# Patient Record
Sex: Male | Born: 1985 | Race: Black or African American | Hispanic: No | Marital: Married | Smoking: Never smoker
Health system: Southern US, Community
[De-identification: ages and names within clinical notes are randomized; demographics above are authoritative.]

## PROBLEM LIST (undated history)

## (undated) DIAGNOSIS — J302 Other seasonal allergic rhinitis: Secondary | ICD-10-CM

---

## 2012-01-18 ENCOUNTER — Encounter (HOSPITAL_BASED_OUTPATIENT_CLINIC_OR_DEPARTMENT_OTHER): Payer: Self-pay | Admitting: *Deleted

## 2012-01-18 ENCOUNTER — Emergency Department (HOSPITAL_BASED_OUTPATIENT_CLINIC_OR_DEPARTMENT_OTHER)
Admission: EM | Admit: 2012-01-18 | Discharge: 2012-01-18 | Disposition: A | Discharge status: Home / Self Care | Payer: Self-pay | Attending: Emergency Medicine | Admitting: Emergency Medicine

## 2012-01-18 DIAGNOSIS — K529 Noninfective gastroenteritis and colitis, unspecified: Secondary | ICD-10-CM

## 2012-01-18 DIAGNOSIS — R112 Nausea with vomiting, unspecified: Secondary | ICD-10-CM | POA: Insufficient documentation

## 2012-01-18 DIAGNOSIS — R197 Diarrhea, unspecified: Secondary | ICD-10-CM | POA: Insufficient documentation

## 2012-01-18 LAB — CBC
MCH: 28.3 pg (ref 26.0–34.0)
MCHC: 36 g/dL (ref 30.0–36.0)
MCV: 78.6 fL (ref 78.0–100.0)
Platelets: 164 10*3/uL (ref 150–400)
RBC: 6.08 MIL/uL — ABNORMAL HIGH (ref 4.22–5.81)

## 2012-01-18 LAB — COMPREHENSIVE METABOLIC PANEL
AST: 24 U/L (ref 0–37)
CO2: 23 mEq/L (ref 19–32)
Calcium: 10.6 mg/dL — ABNORMAL HIGH (ref 8.4–10.5)
Creatinine, Ser: 1.3 mg/dL (ref 0.50–1.35)
GFR calc Af Amer: 88 mL/min — ABNORMAL LOW (ref >90)
GFR calc non Af Amer: 76 mL/min — ABNORMAL LOW (ref >90)
Glucose, Bld: 129 mg/dL — ABNORMAL HIGH (ref 70–99)
Total Protein: 8.2 g/dL (ref 6.0–8.3)

## 2012-01-18 MED ORDER — DIPHENOXYLATE-ATROPINE 2.5-0.025 MG PO TABS
2.0000 | ORAL_TABLET | Freq: Once | ORAL | Status: AC
  Administered 2012-01-18: 2 via ORAL
  Filled 2012-01-18: qty 2

## 2012-01-18 MED ORDER — ONDANSETRON HCL 4 MG/2ML IJ SOLN
4.0000 mg | Freq: Once | INTRAMUSCULAR | Status: AC
  Administered 2012-01-18: 4 mg via INTRAVENOUS

## 2012-01-18 MED ORDER — SODIUM CHLORIDE 0.9 % IV BOLUS (SEPSIS)
1000.0000 mL | Freq: Once | INTRAVENOUS | Status: AC
  Administered 2012-01-18: 1000 mL via INTRAVENOUS

## 2012-01-18 MED ORDER — DIPHENOXYLATE-ATROPINE 2.5-0.025 MG PO TABS: 1.0000 | ORAL_TABLET | Freq: Four times a day (QID) | ORAL | Status: AC | PRN

## 2012-01-18 MED ORDER — ONDANSETRON HCL 4 MG/2ML IJ SOLN
INTRAMUSCULAR | Status: AC
  Filled 2012-01-18: qty 2

## 2012-01-18 MED ORDER — ONDANSETRON 8 MG PO TBDP: 8.0000 mg | ORAL_TABLET | Freq: Three times a day (TID) | ORAL | Status: AC | PRN

## 2012-01-18 NOTE — ED Notes (Signed)
Report received from Andrea RN.

## 2012-01-18 NOTE — ED Notes (Signed)
Pt reports sudden onset of multiple episodes N/V/D tonight.

## 2012-01-18 NOTE — ED Provider Notes (Signed)
History     CSN: [id number]  Arrival date & time 01/18/12  0019   First MD Initiated Contact with Patient 01/18/12 [phone]      Chief Complaint  Patient presents with  . Nausea, vomiting and diarrhea     (Consider location/radiation/quality/duration/timing/severity/associated sxs/prior treatment) HPI This is a 26 year old black male with the onset of nausea, vomiting and diarrhea yesterday evening about 8:30. He states the symptoms were severe at the time but he is no longer nauseated. There was some associated abdominal cramping but no focal or persistent pain. He was given a liter of normal saline IV and Zofran by nursing staff per protocol. He states he feels better but is still thirsty. He has not had any sick contacts.  History reviewed. No pertinent past medical history.  History reviewed. No pertinent past surgical history.  No family history on file.  History  Substance Use Topics  . Smoking status: Never Smoker   . Smokeless tobacco: Not on file  . Alcohol Use: No      Review of Systems  All other systems reviewed and are negative.    Allergies  Review of patient's allergies indicates no known allergies.  Home Medications  No current outpatient prescriptions on file.  BP 121/71  Pulse 117  Temp(Src) 97.9 F (36.6 C) (Oral)  Resp 20  Ht 5\' 10"  (1.778 m)  Wt 175 lb (79.379 kg)  BMI 25.11 kg/m2  SpO2 100%  Physical Exam General: Well-developed, well-nourished male in no acute distress; appearance consistent with age of record HENT: normocephalic, atraumatic; dry mucous the Eyes: pupils equal round and reactive to light; extraocular muscles intact Neck: supple Heart: regular rate and rhythm; tachycardia Lungs: clear to auscultation bilaterally Abdomen: soft; nondistended; nontender; no masses or hepatosplenomegaly; bowel sounds hypoactive Extremities: No deformity; full range of motion; pulses normal Neurologic: Awake, alert and oriented; motor function  intact in all extremities and symmetric; no facial droop Skin: Warm and dry Psychiatric: Normal mood and affect    ED Course  Procedures (including critical care time)     MDM   Nursing notes and vitals signs, including pulse oximetry, reviewed.  Summary of this visit's results, reviewed by myself:  Labs:  Results for orders placed during the hospital encounter of 01/18/12  CBC      Component Value Range   WBC 10.2  4.0 - 10.5 (K/uL)   RBC 6.08 (*) 4.22 - 5.81 (MIL/uL)   Hemoglobin 17.2 (*) 13.0 - 17.0 (g/dL)   HCT 78.4  69.6 - 29.5 (%)   MCV 78.6  78.0 - 100.0 (fL)   MCH 28.3  26.0 - 34.0 (pg)   MCHC 36.0  30.0 - 36.0 (g/dL)   RDW 28.4  13.2 - 44.0 (%)   Platelets 164  150 - 400 (K/uL)  COMPREHENSIVE METABOLIC PANEL      Component Value Range   Sodium 141  135 - 145 (mEq/L)   Potassium 5.0  3.5 - 5.1 (mEq/L)   Chloride 102  96 - 112 (mEq/L)   CO2 23  19 - 32 (mEq/L)   Glucose, Bld 129 (*) 70 - 99 (mg/dL)   BUN 24 (*) 6 - 23 (mg/dL)   Creatinine, Ser 1.02  0.50 - 1.35 (mg/dL)   Calcium 72.5 (*) 8.4 - 10.5 (mg/dL)   Total Protein 8.2  6.0 - 8.3 (g/dL)   Albumin 4.9  3.5 - 5.2 (g/dL)   AST 24  0 - 37 (U/L)   ALT 12  0 - 53 (U/L)   Alkaline Phosphatase 75  39 - 117 (U/L)   Total Bilirubin 0.6  0.3 - 1.2 (mg/dL)   GFR calc non Af Amer 76 (*) >90 (mL/min)   GFR calc Af Amer 88 (*) >90 (mL/min)   2:44 AM Drinking fluids without emesis.         Hanley Seamen, MD 01/18/12 [phone]

## 2012-01-18 NOTE — ED Notes (Signed)
Pt given ice chips. States nausea has subsided greatly.

## 2013-09-08 ENCOUNTER — Emergency Department (HOSPITAL_BASED_OUTPATIENT_CLINIC_OR_DEPARTMENT_OTHER): Payer: Self-pay

## 2013-09-08 ENCOUNTER — Emergency Department (HOSPITAL_BASED_OUTPATIENT_CLINIC_OR_DEPARTMENT_OTHER)
Admission: EM | Admit: 2013-09-08 | Discharge: 2013-09-08 | Disposition: A | Discharge status: Home / Self Care | Payer: Self-pay | Attending: Emergency Medicine | Admitting: Emergency Medicine

## 2013-09-08 ENCOUNTER — Encounter (HOSPITAL_BASED_OUTPATIENT_CLINIC_OR_DEPARTMENT_OTHER): Payer: Self-pay | Admitting: Emergency Medicine

## 2013-09-08 DIAGNOSIS — X58XXXA Exposure to other specified factors, initial encounter: Secondary | ICD-10-CM | POA: Insufficient documentation

## 2013-09-08 DIAGNOSIS — Y929 Unspecified place or not applicable: Secondary | ICD-10-CM | POA: Insufficient documentation

## 2013-09-08 DIAGNOSIS — R05 Cough: Secondary | ICD-10-CM | POA: Insufficient documentation

## 2013-09-08 DIAGNOSIS — B9789 Other viral agents as the cause of diseases classified elsewhere: Secondary | ICD-10-CM | POA: Insufficient documentation

## 2013-09-08 DIAGNOSIS — S335XXA Sprain of ligaments of lumbar spine, initial encounter: Secondary | ICD-10-CM | POA: Insufficient documentation

## 2013-09-08 DIAGNOSIS — Y939 Activity, unspecified: Secondary | ICD-10-CM | POA: Insufficient documentation

## 2013-09-08 DIAGNOSIS — S39012A Strain of muscle, fascia and tendon of lower back, initial encounter: Secondary | ICD-10-CM

## 2013-09-08 DIAGNOSIS — IMO0002 Reserved for concepts with insufficient information to code with codable children: Secondary | ICD-10-CM | POA: Insufficient documentation

## 2013-09-08 DIAGNOSIS — R059 Cough, unspecified: Secondary | ICD-10-CM | POA: Insufficient documentation

## 2013-09-08 LAB — CBC WITH DIFFERENTIAL/PLATELET
Eosinophils Absolute: 0.1 10*3/uL (ref 0.0–0.7)
Lymphocytes Relative: 27 % (ref 12–46)
Lymphs Abs: 1.4 10*3/uL (ref 0.7–4.0)
Neutro Abs: 3.1 10*3/uL (ref 1.7–7.7)
Neutrophils Relative %: 62 % (ref 43–77)
Platelets: 191 10*3/uL (ref 150–400)
RBC: 5.24 MIL/uL (ref 4.22–5.81)
WBC: 5.1 10*3/uL (ref 4.0–10.5)

## 2013-09-08 LAB — BASIC METABOLIC PANEL
CO2: 25 mEq/L (ref 19–32)
Chloride: 102 mEq/L (ref 96–112)
GFR calc non Af Amer: >90 mL/min (ref >90)
Glucose, Bld: 103 mg/dL — ABNORMAL HIGH (ref 70–99)
Potassium: 4 mEq/L (ref 3.5–5.1)
Sodium: 138 mEq/L (ref 135–145)

## 2013-09-08 LAB — URINALYSIS, ROUTINE W REFLEX MICROSCOPIC
Nitrite: NEGATIVE
Specific Gravity, Urine: 1.021 (ref 1.005–1.030)
Urobilinogen, UA: 1 mg/dL (ref 0.0–1.0)

## 2013-09-08 MED ORDER — OXYCODONE-ACETAMINOPHEN 5-325 MG PO TABS
2.0000 | ORAL_TABLET | Freq: Once | ORAL | Status: AC
  Administered 2013-09-08: 2 via ORAL
  Filled 2013-09-08: qty 2

## 2013-09-08 NOTE — ED Provider Notes (Signed)
CSN: [id number]     Arrival date & time 09/08/13  0541 History   First MD Initiated Contact with Patient 09/08/13 [phone]     Chief Complaint  Patient presents with  . Back Pain  . Cough   (Consider location/radiation/quality/duration/timing/severity/associated sxs/prior Treatment) HPI 27 y.o. Male complaining of right lower back pain began yesterday with no known trauma.  Pain is throbbing and worsens with movement.  No hematuria, frequency, or dysuria.  He has not had any similar symptoms in the past.  He has had a recent uri with some residual coughing.  He took tylenol and muscle relaxants with pain decreasing to 5/10.   History reviewed. No pertinent past medical history. History reviewed. No pertinent past surgical history. No family history on file. History  Substance Use Topics  . Smoking status: Never Smoker   . Smokeless tobacco: Not on file  . Alcohol Use: No    Review of Systems  All other systems reviewed and are negative.    Allergies  Review of patient's allergies indicates no known allergies.  Home Medications  No current outpatient prescriptions on file. BP 140/80  Pulse 88  Temp(Src) 98.3 F (36.8 C) (Oral)  Resp 18  Ht 5\' 9"  (1.753 m)  Wt 185 lb (83.915 kg)  BMI 27.31 kg/m2  SpO2 100% Physical Exam  Nursing note and vitals reviewed. Constitutional: He is oriented to person, place, and time. He appears well-developed and well-nourished.  HENT:  Head: Normocephalic and atraumatic.  Right Ear: Tympanic membrane and external ear normal.  Left Ear: Tympanic membrane and external ear normal.  Nose: Nose normal. Right sinus exhibits no maxillary sinus tenderness and no frontal sinus tenderness. Left sinus exhibits no maxillary sinus tenderness and no frontal sinus tenderness.  Eyes: Conjunctivae and EOM are normal. Pupils are equal, round, and reactive to light. Right eye exhibits no nystagmus. Left eye exhibits no nystagmus.  Neck: Normal range of motion.  Neck supple.  Cardiovascular: Normal rate, regular rhythm, normal heart sounds and intact distal pulses.   Pulmonary/Chest: Effort normal and breath sounds normal. No respiratory distress. He exhibits no tenderness.  Abdominal: Soft. Bowel sounds are normal. He exhibits no distension and no mass. There is no tenderness.  Musculoskeletal: Normal range of motion. He exhibits tenderness. He exhibits no edema.  Back is slightly ttp in left lateral lumbar area- no erythema, swelling noted.   Neurological: He is alert and oriented to person, place, and time. He has normal strength and normal reflexes. No sensory deficit. He displays a negative Romberg sign. GCS eye subscore is 4. GCS verbal subscore is 5. GCS motor subscore is 6.  Reflex Scores:      Tricep reflexes are 2+ on the right side and 2+ on the left side.      Bicep reflexes are 2+ on the right side and 2+ on the left side.      Brachioradialis reflexes are 2+ on the right side and 2+ on the left side.      Patellar reflexes are 2+ on the right side and 2+ on the left side.      Achilles reflexes are 2+ on the right side and 2+ on the left side. Patient with normal gait without ataxia, shuffling, spasm, or antalgia. Speech is normal without dysarthria, dysphasia, or aphasia. Muscle strength is 5/5 in bilateral shoulders, elbow flexor and extensors, wrist flexor and extensors, and intrinsic hand muscles. 5/5 bilateral lower extremity hip flexors, extensors, knee flexors and extensors,  and ankle dorsi and plantar flexors.    Skin: Skin is warm and dry. No rash noted.  Psychiatric: He has a normal mood and affect. His behavior is normal. Judgment and thought content normal.    ED Course  Procedures (including critical care time) Labs Review Labs Reviewed  BASIC METABOLIC PANEL - Abnormal; Notable for the following:    Glucose, Bld 103 (*)    All other components within normal limits  URINALYSIS, ROUTINE W REFLEX MICROSCOPIC  CBC WITH  DIFFERENTIAL   Imaging Review Dg Chest 2 View  09/08/2013   CLINICAL DATA:  Left mid and upper back pain. Cough and shortness of breath.  EXAM: CHEST  2 VIEW  COMPARISON:  None.  FINDINGS: The lungs are well-aerated and clear. There is no evidence of focal opacification, pleural effusion or pneumothorax.  The heart is normal in size; the mediastinal contour is within normal limits. No acute osseous abnormalities are seen.  IMPRESSION: No acute cardiopulmonary process seen.   Electronically Signed   By: Roanna Raider M.D.   On: 09/08/2013 06:34    EKG Interpretation   None       MDM  No diagnosis found. Lumbar pain without evidence of neurologic abnormality.  Labs wnl, cxr done due to patient's recent uri and cough and no acute abnormality.      Hilario Quarry, MD 09/08/13 [phone]

## 2013-09-08 NOTE — ED Notes (Signed)
Pt complains of left mid/upper back pain.  Pt reports getting over the flu and having a cough.  Pt denies injury.  Denies trouble urinating. Pt complains of SOB.  Pt unable to report if he has had a fever.

## 2013-09-08 NOTE — ED Notes (Signed)
Pt complains of pain in left mid/upper back.  Pain is reproducible.  Pt complains of SOB but denies chest pain or fever.  Reports just getting over the flu.  Pt took a muscle relaxer and tylenol prior to coming in.

## 2013-09-08 NOTE — ED Notes (Signed)
Patient was seen this morning for back pain and is back because he states that the pain has gotten worse

## 2013-09-09 ENCOUNTER — Emergency Department (HOSPITAL_BASED_OUTPATIENT_CLINIC_OR_DEPARTMENT_OTHER)
Admission: EM | Admit: 2013-09-09 | Discharge: 2013-09-09 | Disposition: A | Discharge status: Home / Self Care | Payer: Self-pay | Attending: Emergency Medicine | Admitting: Emergency Medicine

## 2013-09-09 DIAGNOSIS — B349 Viral infection, unspecified: Secondary | ICD-10-CM

## 2013-09-09 DIAGNOSIS — T148XXA Other injury of unspecified body region, initial encounter: Secondary | ICD-10-CM

## 2013-09-09 MED ORDER — METHOCARBAMOL 500 MG PO TABS
ORAL_TABLET | ORAL | Status: AC
  Administered 2013-09-09: 1000 mg via ORAL
  Filled 2013-09-09: qty 2

## 2013-09-09 MED ORDER — KETOROLAC TROMETHAMINE 60 MG/2ML IM SOLN
INTRAMUSCULAR | Status: AC
  Administered 2013-09-09: 60 mg via INTRAMUSCULAR
  Filled 2013-09-09: qty 2

## 2013-09-09 MED ORDER — METHOCARBAMOL 500 MG PO TABS: 500.0000 mg | ORAL_TABLET | Freq: Two times a day (BID) | ORAL | Status: DC

## 2013-09-09 MED ORDER — MELOXICAM 7.5 MG PO TABS: 7.5000 mg | ORAL_TABLET | Freq: Every day | ORAL | Status: DC

## 2013-09-09 MED ORDER — KETOROLAC TROMETHAMINE 60 MG/2ML IM SOLN
60.0000 mg | Freq: Once | INTRAMUSCULAR | Status: AC
  Administered 2013-09-09: 60 mg via INTRAMUSCULAR

## 2013-09-09 MED ORDER — METHOCARBAMOL 500 MG PO TABS
1000.0000 mg | ORAL_TABLET | Freq: Once | ORAL | Status: AC
  Administered 2013-09-09: 1000 mg via ORAL

## 2013-09-09 MED ORDER — TRAMADOL HCL 50 MG PO TABS: 50.0000 mg | ORAL_TABLET | Freq: Four times a day (QID) | ORAL | Status: DC | PRN

## 2013-09-09 NOTE — ED Provider Notes (Signed)
CSN: [id number]     Arrival date & time 09/08/13  2117 History   First MD Initiated Contact with Patient 09/09/13 0051     Chief Complaint  Patient presents with  . Back Pain   (Consider location/radiation/quality/duration/timing/severity/associated sxs/prior Treatment) Patient is a 27 y.o. male presenting with back pain. The history is provided by the patient.  Back Pain Pain location: left posterior mid left back. Quality:  Aching Radiates to:  Does not radiate Pain severity:  Moderate Pain is:  Same all the time Onset quality:  Gradual Timing:  Constant Progression:  Unchanged Context: not emotional stress, not MCA, not MVA, not physical stress and not recent illness   Context comment:  Cough Relieved by:  Nothing Worsened by:  Nothing tried Ineffective treatments:  None tried Associated symptoms: no abdominal pain, no abdominal swelling, no bladder incontinence, no bowel incontinence, no chest pain, no dysuria, no fever, no headaches, no leg pain, no numbness, no paresthesias, no pelvic pain, no perianal numbness, no tingling, no weakness and no weight loss   Risk factors: no hx of cancer   Seen earlier for cough and right back pain and now presents with left mid pain between the posterior ribs.    History reviewed. No pertinent past medical history. History reviewed. No pertinent past surgical history. No family history on file. History  Substance Use Topics  . Smoking status: Never Smoker   . Smokeless tobacco: Not on file  . Alcohol Use: No    Review of Systems  Constitutional: Negative for fever and weight loss.  Respiratory: Positive for cough. Negative for choking and chest tightness.   Cardiovascular: Negative for chest pain.  Gastrointestinal: Negative for abdominal pain and bowel incontinence.  Genitourinary: Negative for bladder incontinence, dysuria and pelvic pain.  Musculoskeletal: Positive for back pain.  Neurological: Negative for tingling, weakness,  numbness, headaches and paresthesias.  All other systems reviewed and are negative.    Allergies  Review of patient's allergies indicates no known allergies.  Home Medications  No current outpatient prescriptions on file. BP 153/85  Pulse 130  Temp(Src) 99.1 F (37.3 C) (Oral)  Resp 20  SpO2 98% Physical Exam  Constitutional: He is oriented to person, place, and time. He appears well-developed and well-nourished. No distress.  HENT:  Head: Normocephalic and atraumatic.  Mouth/Throat: Oropharynx is clear and moist.  Eyes: Conjunctivae are normal. Pupils are equal, round, and reactive to light.  Neck: Normal range of motion. Neck supple.  Cardiovascular: Normal rate, regular rhythm and intact distal pulses.   Pulmonary/Chest: Effort normal and breath sounds normal. He has no wheezes. He has no rales.    Abdominal: Soft. Bowel sounds are normal. There is no tenderness. There is no rebound and no guarding.  Musculoskeletal: Normal range of motion.  Neurological: He is alert and oriented to person, place, and time.  Skin: Skin is warm and dry.  Psychiatric: He has a normal mood and affect.    ED Course  Procedures (including critical care time) Labs Review Labs Reviewed  D-DIMER, QUANTITATIVE   Imaging Review Dg Chest 2 View  09/08/2013   CLINICAL DATA:  Left mid and upper back pain. Cough and shortness of breath.  EXAM: CHEST  2 VIEW  COMPARISON:  None.  FINDINGS: The lungs are well-aerated and clear. There is no evidence of focal opacification, pleural effusion or pneumothorax.  The heart is normal in size; the mediastinal contour is within normal limits. No acute osseous abnormalities are seen.  IMPRESSION: No acute cardiopulmonary process seen.   Electronically Signed   By: Roanna Raider M.D.   On: 09/08/2013 06:34    EKG Interpretation   None       MDM  No diagnosis found. Had negative labs and chest xrays this am and the area is in the posterior thoracic  region.  Ddimer is also negative excluding PE.  Pain is reproducible and between ribs.  Patient has likely strained a muscle with movement or coughing.  He is getting over the flu  Resting comfortably in the room.  Feels improved post medication  Sandrina Heaton K Nuh Lipton-Rasch, MD 09/09/13 [phone]

## 2014-07-07 ENCOUNTER — Encounter (HOSPITAL_BASED_OUTPATIENT_CLINIC_OR_DEPARTMENT_OTHER): Payer: Self-pay | Admitting: Emergency Medicine

## 2014-07-07 ENCOUNTER — Emergency Department (HOSPITAL_BASED_OUTPATIENT_CLINIC_OR_DEPARTMENT_OTHER)
Admission: EM | Admit: 2014-07-07 | Discharge: 2014-07-07 | Disposition: A | Discharge status: Home / Self Care | Payer: Self-pay | Attending: Emergency Medicine | Admitting: Emergency Medicine

## 2014-07-07 DIAGNOSIS — H00013 Hordeolum externum right eye, unspecified eyelid: Secondary | ICD-10-CM

## 2014-07-07 DIAGNOSIS — Z79899 Other long term (current) drug therapy: Secondary | ICD-10-CM | POA: Insufficient documentation

## 2014-07-07 DIAGNOSIS — H00011 Hordeolum externum right upper eyelid: Secondary | ICD-10-CM | POA: Insufficient documentation

## 2014-07-07 DIAGNOSIS — Z791 Long term (current) use of non-steroidal anti-inflammatories (NSAID): Secondary | ICD-10-CM | POA: Insufficient documentation

## 2014-07-07 MED ORDER — ERYTHROMYCIN 5 MG/GM OP OINT: TOPICAL_OINTMENT | OPHTHALMIC | Status: DC

## 2014-07-07 NOTE — ED Notes (Signed)
Pt has a sty on his right eye lid since last Sunday.

## 2014-07-07 NOTE — Discharge Instructions (Signed)
Sty A sty (hordeolum) is an infection of a gland in the eyelid located at the base of the eyelash. A sty may develop a white or yellow head of pus. It can be puffy (swollen). Usually, the sty will burst and pus will come out on its own. They do not leave lumps in the eyelid once they drain. A sty is often confused with another form of cyst of the eyelid called a chalazion. Chalazions occur within the eyelid and not on the edge where the bases of the eyelashes are. They often are red, sore and then form firm lumps in the eyelid. CAUSES   Germs (bacteria).  Lasting (chronic) eyelid inflammation. SYMPTOMS   Tenderness, redness and swelling along the edge of the eyelid at the base of the eyelashes.  Sometimes, there is a white or yellow head of pus. It may or may not drain. DIAGNOSIS  An ophthalmologist will be able to distinguish between a sty and a chalazion and treat the condition appropriately.  TREATMENT   Styes are typically treated with warm packs (compresses) until drainage occurs.  In rare cases, medicines that kill germs (antibiotics) may be prescribed. These antibiotics may be in the form of drops, cream or pills.  If a hard lump has formed, it is generally necessary to do a small incision and remove the hardened contents of the cyst in a minor surgical procedure done in the office.  In suspicious cases, your caregiver may send the contents of the cyst to the lab to be certain that it is not a rare, but dangerous form of cancer of the glands of the eyelid. HOME CARE INSTRUCTIONS   Wash your hands often and dry them with a clean towel. Avoid touching your eyelid. This may spread the infection to other parts of the eye.  Apply heat to your eyelid for 10 to 20 minutes, several times a day, to ease pain and help to heal it faster.  Do not squeeze the sty. Allow it to drain on its own. Wash your eyelid carefully 3 to 4 times per day to remove any pus. SEEK IMMEDIATE MEDICAL CARE IF:     Your eye becomes painful or puffy (swollen).  Your vision changes.  Your sty does not drain by itself within 3 days.  Your sty comes back within a short period of time, even with treatment.  You have redness (inflammation) around the eye.  You have a fever. Document Released: 06/08/2005 Document Revised: 11/21/2011 Document Reviewed: 12/13/2013 [facility] Patient Information 2015 [employer], [place]. This information is not intended to replace advice given to you by your health care provider. Make sure you discuss any questions you have with your health care provider.  Sty A sty (hordeolum) is an infection of a gland in the eyelid located at the base of the eyelash. A sty may develop a white or yellow head of pus. It can be puffy (swollen). Usually, the sty will burst and pus will come out on its own. They do not leave lumps in the eyelid once they drain. A sty is often confused with another form of cyst of the eyelid called a chalazion. Chalazions occur within the eyelid and not on the edge where the bases of the eyelashes are. They often are red, sore and then form firm lumps in the eyelid. CAUSES   Germs (bacteria).  Lasting (chronic) eyelid inflammation. SYMPTOMS   Tenderness, redness and swelling along the edge of the eyelid at the base of the eyelashes.  Sometimes, there is a white or yellow head of pus. It may or may not drain. DIAGNOSIS  An ophthalmologist will be able to distinguish between a sty and a chalazion and treat the condition appropriately.  TREATMENT   Styes are typically treated with warm packs (compresses) until drainage occurs.  In rare cases, medicines that kill germs (antibiotics) may be prescribed. These antibiotics may be in the form of drops, cream or pills.  If a hard lump has formed, it is generally necessary to do a small incision and remove the hardened contents of the cyst in a minor surgical procedure done in the office.  In suspicious cases, your  caregiver may send the contents of the cyst to the lab to be certain that it is not a rare, but dangerous form of cancer of the glands of the eyelid. HOME CARE INSTRUCTIONS   Wash your hands often and dry them with a clean towel. Avoid touching your eyelid. This may spread the infection to other parts of the eye.  Apply heat to your eyelid for 10 to 20 minutes, several times a day, to ease pain and help to heal it faster.  Do not squeeze the sty. Allow it to drain on its own. Wash your eyelid carefully 3 to 4 times per day to remove any pus. SEEK IMMEDIATE MEDICAL CARE IF:   Your eye becomes painful or puffy (swollen).  Your vision changes.  Your sty does not drain by itself within 3 days.  Your sty comes back within a short period of time, even with treatment.  You have redness (inflammation) around the eye.  You have a fever. Document Released: 06/08/2005 Document Revised: 11/21/2011 Document Reviewed: 12/13/2013 [facility]Care Patient Information 2015 [place], [place]. This information is not intended to replace advice given to you by your health care provider. Make sure you discuss any questions you have with your health care provider.

## 2014-07-07 NOTE — ED Provider Notes (Signed)
CSN: [id number]     Arrival date & time 07/07/14  1601 History  This chart was scribed for Wyatt SheffieldForrest Marquiz Sotelo, MD by Richarda Overlieichard Holland, ED Scribe. This patient was seen in room MH11/MH11 and the patient's care was started 4:36 PM.    Chief Complaint  Patient presents with  . Eye Pain    Patient is a 28 y.o. male presenting with eye pain. The history is provided by the patient. No language interpreter was used.  Eye Pain This is a new problem. The current episode started more than 1 week ago. The problem occurs constantly. The problem has not changed since onset.Pertinent negatives include no chest pain, no abdominal pain and no headaches. The symptoms are relieved by heat. He has tried a warm compress for the symptoms.   HPI Comments: Claudina LickJeremy Kuri is a 28 y.o. male who presents to the Emergency Department complaining of a right eye styes for the past 8 days. Pt says it's not painful, just irritating. He states he has had styes before but they resolved with warm compress and cleaning. He reports it is draining and is a "light cream color". He is unsure if it is getting bigger.   History reviewed. No pertinent past medical history. History reviewed. No pertinent past surgical history. No family history on file. History  Substance Use Topics  . Smoking status: Never Smoker   . Smokeless tobacco: Not on file  . Alcohol Use: No    Review of Systems  Constitutional: Negative for appetite change and fatigue.  HENT: Negative for congestion, ear discharge and sinus pressure.   Eyes: Positive for pain and discharge.  Respiratory: Negative for cough.   Cardiovascular: Negative for chest pain.  Gastrointestinal: Negative for abdominal pain and diarrhea.  Genitourinary: Negative for frequency and hematuria.  Musculoskeletal: Negative for back pain.  Skin: Negative for rash.  Neurological: Negative for seizures and headaches.  Psychiatric/Behavioral: Negative for hallucinations.  All other  systems reviewed and are negative.     Allergies  Review of patient's allergies indicates no known allergies.  Home Medications   Prior to Admission medications   Medication Sig Start Date End Date Taking? Authorizing Provider  meloxicam (MOBIC) 7.5 MG tablet Take 1 tablet (7.5 mg total) by mouth daily. 09/09/13   April K Palumbo-Rasch, MD  methocarbamol (ROBAXIN) 500 MG tablet Take 1 tablet (500 mg total) by mouth 2 (two) times daily. 09/09/13   April K Palumbo-Rasch, MD  traMADol (ULTRAM) 50 MG tablet Take 1 tablet (50 mg total) by mouth every 6 (six) hours as needed. 09/09/13   April K Palumbo-Rasch, MD   BP 143/78  Pulse 90  Temp(Src) 98.4 F (36.9 C) (Oral)  Resp 16  Ht 5\' 9"  (1.753 m)  Wt 190 lb (86.183 kg)  BMI 28.05 kg/m2  SpO2 100% Physical Exam  Nursing note and vitals reviewed. Constitutional: He is oriented to person, place, and time. He appears well-developed.  HENT:  Head: Normocephalic.  Right Ear: External ear normal.  Left Ear: External ear normal.  Nose: Nose normal.  Mouth/Throat: Oropharynx is clear and moist.  Eyes: Conjunctivae and EOM are normal. Pupils are equal, round, and reactive to light. No scleral icterus.  Mild swelling to the right upper eyelid on the lateral margin with mild obstruction of the mebomian glands in that area. No erythema noted.   Neck: Neck supple. No thyromegaly present.  Cardiovascular: Normal rate and regular rhythm.  Exam reveals no gallop and no friction rub.  No murmur heard. Pulmonary/Chest: No stridor. He has no wheezes. He has no rales. He exhibits no tenderness.  Abdominal: He exhibits no distension. There is no tenderness. There is no rebound.  Musculoskeletal: Normal range of motion. He exhibits no edema.  Lymphadenopathy:    He has no cervical adenopathy.  Neurological: He is oriented to person, place, and time. He exhibits normal muscle tone. Coordination normal.  Skin: No rash noted. No erythema.   Psychiatric: He has a normal mood and affect. His behavior is normal.    ED Course  Procedures DIAGNOSTIC STUDIES: Oxygen Saturation is 100% on RA, normal by my interpretation.    COORDINATION OF CARE: 4:44 PM Discussed treatment plan with pt at bedside and pt agreed to plan.   Labs Review Labs Reviewed - No data to display  Imaging Review No results found.   EKG Interpretation None      MDM   Final diagnoses:  Stye external, right   4:55 PM 28 y.o. male here w/ stye to right upper eyelid. He has noted mild drainage. No erythema or induration to suggest cellulitis. Will cover with erythromycin ophthalmic ointment due to ongoing symptoms. Will have him follow-up at the eye care center in 5-6 days if no better. Will recommend continued symptomatically therapy with warm compresses.   4:55 PM:  I have discussed the diagnosis/risks/treatment options with the patient and believe the pt to be eligible for discharge home to follow-up with eye care center. We also discussed returning to the ED immediately if new or worsening sx occur. We discussed the sx which are most concerning (e.g., redness, swelling, fever) that necessitate immediate return. Medications administered to the patient during their visit and any new prescriptions provided to the patient are listed below.  Medications given during this visit Medications - No data to display  New Prescriptions   ERYTHROMYCIN OPHTHALMIC OINTMENT    Place a 1/2 inch ribbon of ointment into the lower eyelid every 6 hours daily for 7 days.       I personally performed the services described in this documentation, which was scribed in my presence. The recorded information has been reviewed and is accurate.      Wyatt SheffieldForrest Phillippa Straub, MD 07/07/14 [phone]

## 2014-12-19 ENCOUNTER — Encounter (HOSPITAL_BASED_OUTPATIENT_CLINIC_OR_DEPARTMENT_OTHER): Payer: Self-pay

## 2014-12-19 ENCOUNTER — Emergency Department (HOSPITAL_BASED_OUTPATIENT_CLINIC_OR_DEPARTMENT_OTHER): Payer: Self-pay

## 2014-12-19 ENCOUNTER — Emergency Department (HOSPITAL_BASED_OUTPATIENT_CLINIC_OR_DEPARTMENT_OTHER)
Admission: EM | Admit: 2014-12-19 | Discharge: 2014-12-19 | Disposition: A | Discharge status: Home / Self Care | Payer: Self-pay | Attending: Emergency Medicine | Admitting: Emergency Medicine

## 2014-12-19 DIAGNOSIS — B349 Viral infection, unspecified: Secondary | ICD-10-CM | POA: Insufficient documentation

## 2014-12-19 HISTORY — DX: Other seasonal allergic rhinitis: J30.2

## 2014-12-19 LAB — URINALYSIS, ROUTINE W REFLEX MICROSCOPIC
Bilirubin Urine: NEGATIVE
GLUCOSE, UA: 100 mg/dL — AB
HGB URINE DIPSTICK: NEGATIVE
KETONES UR: NEGATIVE mg/dL
LEUKOCYTES UA: NEGATIVE
Nitrite: NEGATIVE
PH: 5.5 (ref 5.0–8.0)
Protein, ur: NEGATIVE mg/dL
Specific Gravity, Urine: 1.024 (ref 1.005–1.030)
Urobilinogen, UA: 1 mg/dL (ref 0.0–1.0)

## 2014-12-19 MED ORDER — ONDANSETRON 4 MG PO TBDP
4.0000 mg | ORAL_TABLET | Freq: Once | ORAL | Status: AC
  Administered 2014-12-19: 4 mg via ORAL
  Filled 2014-12-19: qty 1

## 2014-12-19 MED ORDER — LOPERAMIDE HCL 2 MG PO CAPS: 4.0000 mg | ORAL_CAPSULE | ORAL | Status: DC | PRN

## 2014-12-19 MED ORDER — LOPERAMIDE HCL 2 MG PO CAPS
4.0000 mg | ORAL_CAPSULE | Freq: Once | ORAL | Status: AC
  Administered 2014-12-19: 4 mg via ORAL
  Filled 2014-12-19: qty 2

## 2014-12-19 MED ORDER — ONDANSETRON 4 MG PO TBDP: 4.0000 mg | ORAL_TABLET | Freq: Three times a day (TID) | ORAL | Status: DC | PRN

## 2014-12-19 NOTE — ED Provider Notes (Signed)
CSN: [id number]     Arrival date & time 12/19/14  4098 History   First MD Initiated Contact with Patient 12/19/14 [phone]     Chief Complaint  Patient presents with  . Shortness of Breath     (Consider location/radiation/quality/duration/timing/severity/associated sxs/prior Treatment) HPI  This is a 29 year old male who presents with multiple symptoms. Patient reports six-day history of shortness of breath and productive cough. Symptoms gotten somewhat better but have persisted. Also reports nausea and diarrhea since Saturday. No blood in the stools. No known sick contacts. States that earlier in his illness he had chills and sore throat but that has resolved. Denies any fever. He is taken TheraFlu and Tylenol without significant relief of his symptoms.  Patient denies any chest pain or abdominal pain.  Past Medical History  Diagnosis Date  . Seasonal allergies    History reviewed. No pertinent past surgical history. No family history on file. History  Substance Use Topics  . Smoking status: Never Smoker   . Smokeless tobacco: Not on file  . Alcohol Use: No    Review of Systems  Constitutional: Positive for chills. Negative for fever.  HENT: Positive for sore throat.   Respiratory: Positive for cough and shortness of breath. Negative for chest tightness.   Cardiovascular: Negative.  Negative for chest pain and leg swelling.  Gastrointestinal: Positive for nausea and diarrhea. Negative for vomiting and abdominal pain.  Genitourinary: Negative.  Negative for dysuria.  Musculoskeletal: Negative for back pain.  Skin: Negative for rash.  Neurological: Negative for headaches.  All other systems reviewed and are negative.     Allergies  Review of patient's allergies indicates no known allergies.  Home Medications   Prior to Admission medications   Medication Sig Start Date End Date Taking? Authorizing Provider  loperamide (IMODIUM) 2 MG capsule Take 2 capsules (4 mg total) by  mouth as needed for diarrhea or loose stools. 12/19/14   Shon Baton, MD  ondansetron (ZOFRAN-ODT) 4 MG disintegrating tablet Take 1 tablet (4 mg total) by mouth every 8 (eight) hours as needed for nausea or vomiting. 12/19/14   Shon Baton, MD   BP 148/88 mmHg  Pulse 99  Temp(Src) 98.2 F (36.8 C) (Oral)  Resp 18  Ht  (1.778 m)  Wt 185 lb (83.915 kg)  BMI 26.54 kg/m2  SpO2 99% Physical Exam  Constitutional: He is oriented to person, place, and time. He appears well-developed and well-nourished. No distress.  HENT:  Head: Normocephalic and atraumatic.  Mouth/Throat: Oropharynx is clear and moist. No oropharyngeal exudate.  Eyes: Pupils are equal, round, and reactive to light.  Neck: Neck supple.  Cardiovascular: Normal rate, regular rhythm and normal heart sounds.   No murmur heard. Pulmonary/Chest: Effort normal and breath sounds normal. No respiratory distress. He has no wheezes.  Abdominal: Soft. Bowel sounds are normal. There is no tenderness. There is no rebound.  Musculoskeletal: He exhibits no edema.  Lymphadenopathy:    He has no cervical adenopathy.  Neurological: He is alert and oriented to person, place, and time.  Skin: Skin is warm and dry.  Psychiatric: He has a normal mood and affect.  Nursing note and vitals reviewed.   ED Course  Procedures (including critical care time) Labs Review Labs Reviewed  URINALYSIS, ROUTINE W REFLEX MICROSCOPIC - Abnormal; Notable for the following:    Glucose, UA 100 (*)    All other components within normal limits    Imaging Review Dg Chest 2 View  12/19/2014   CLINICAL DATA:  Shortness of breath for 6 days.  EXAM: CHEST  2 VIEW  COMPARISON:  PA and lateral chest 09/06/2013.  FINDINGS: Minimal linear atelectasis is seen the lung bases. The lungs are otherwise clear heart size is normal. No pneumothorax or pleural effusion. No focal bony abnormality.  IMPRESSION: No acute disease.   Electronically Signed   By: Drusilla Kannerhomas   Dalessio M.D.   On: 12/19/2014 10:09     EKG Interpretation None      MDM   Final diagnoses:  Viral syndrome    History presents with multiple symptoms. Clinical picture most suspicious for acute viral syndrome. Currently only has nausea, diarrhea, and shortness of breath. Pulmonary and abdominal exams are reassuring. Patient given Imodium and Zofran. Chest x-ray without evidence of pneumonia. Discuss with patient continued supportive care at home including Imodium, Zofran, and ibuprofen for any myalgias. Patient stated understanding.  After history, exam, and medical workup I feel the patient has been appropriately medically screened and is safe for discharge home. Pertinent diagnoses were discussed with the patient. Patient was given return precautions.     Shon Batonourtney F Neveah Bang, MD 12/19/14 1054

## 2014-12-19 NOTE — ED Notes (Signed)
Developed myalgias, chills, sweats and sore throat 6 days ago and symptoms have progressively worsened.  Now reports a productive cough. Mild headache, blowing greenish yellow drainage and exertional SOB.  Symptoms unrelieved after taking TheraFlu and Tylenol. States myalgias and sore throat have subsided.

## 2014-12-19 NOTE — Discharge Instructions (Signed)

## 2014-12-19 NOTE — ED Notes (Signed)
Patient transported to X-ray 

## 2015-01-05 ENCOUNTER — Emergency Department (HOSPITAL_BASED_OUTPATIENT_CLINIC_OR_DEPARTMENT_OTHER): Payer: Self-pay

## 2015-01-05 ENCOUNTER — Encounter (HOSPITAL_BASED_OUTPATIENT_CLINIC_OR_DEPARTMENT_OTHER): Payer: Self-pay | Admitting: *Deleted

## 2015-01-05 ENCOUNTER — Emergency Department (HOSPITAL_BASED_OUTPATIENT_CLINIC_OR_DEPARTMENT_OTHER)
Admission: EM | Admit: 2015-01-05 | Discharge: 2015-01-05 | Disposition: A | Discharge status: Home / Self Care | Payer: Self-pay | Attending: Emergency Medicine | Admitting: Emergency Medicine

## 2015-01-05 DIAGNOSIS — K529 Noninfective gastroenteritis and colitis, unspecified: Secondary | ICD-10-CM

## 2015-01-05 LAB — CBC WITH DIFFERENTIAL/PLATELET
BASOS ABS: 0 10*3/uL (ref 0.0–0.1)
BASOS PCT: 0 % (ref 0–1)
EOS ABS: 0 10*3/uL (ref 0.0–0.7)
EOS PCT: 1 % (ref 0–5)
HEMATOCRIT: 40.9 % (ref 39.0–52.0)
Hemoglobin: 14 g/dL (ref 13.0–17.0)
LYMPHS PCT: 12 % (ref 12–46)
Lymphs Abs: 0.4 10*3/uL — ABNORMAL LOW (ref 0.7–4.0)
MCH: 26.9 pg (ref 26.0–34.0)
MCHC: 34.2 g/dL (ref 30.0–36.0)
MCV: 78.7 fL (ref 78.0–100.0)
Monocytes Absolute: 0.4 10*3/uL (ref 0.1–1.0)
Monocytes Relative: 11 % (ref 3–12)
Neutro Abs: 2.8 10*3/uL (ref 1.7–7.7)
Neutrophils Relative %: 76 % (ref 43–77)
Platelets: 164 10*3/uL (ref 150–400)
RBC: 5.2 MIL/uL (ref 4.22–5.81)
RDW: 14.3 % (ref 11.5–15.5)
WBC: 3.7 10*3/uL — AB (ref 4.0–10.5)

## 2015-01-05 LAB — COMPREHENSIVE METABOLIC PANEL
ALT: 56 U/L — ABNORMAL HIGH (ref 0–53)
AST: 48 U/L — ABNORMAL HIGH (ref 0–37)
Albumin: 4.1 g/dL (ref 3.5–5.2)
Alkaline Phosphatase: 75 U/L (ref 39–117)
Anion gap: 8 (ref 5–15)
BUN: 9 mg/dL (ref 6–23)
CALCIUM: 8.9 mg/dL (ref 8.4–10.5)
CO2: 26 mmol/L (ref 19–32)
Chloride: 103 mmol/L (ref 96–112)
Creatinine, Ser: 1.13 mg/dL (ref 0.50–1.35)
GFR calc Af Amer: >90 mL/min (ref >90)
GFR, EST NON AFRICAN AMERICAN: 88 mL/min — AB (ref >90)
GLUCOSE: 128 mg/dL — AB (ref 70–99)
Potassium: 3.7 mmol/L (ref 3.5–5.1)
Sodium: 137 mmol/L (ref 135–145)
Total Bilirubin: 0.7 mg/dL (ref 0.3–1.2)
Total Protein: 7.4 g/dL (ref 6.0–8.3)

## 2015-01-05 LAB — URINALYSIS, ROUTINE W REFLEX MICROSCOPIC
Bilirubin Urine: NEGATIVE
GLUCOSE, UA: NEGATIVE mg/dL
Hgb urine dipstick: NEGATIVE
KETONES UR: NEGATIVE mg/dL
LEUKOCYTES UA: NEGATIVE
NITRITE: NEGATIVE
PH: 7 (ref 5.0–8.0)
PROTEIN: NEGATIVE mg/dL
SPECIFIC GRAVITY, URINE: 1.016 (ref 1.005–1.030)
UROBILINOGEN UA: 2 mg/dL — AB (ref 0.0–1.0)

## 2015-01-05 LAB — LIPASE, BLOOD: Lipase: 66 U/L — ABNORMAL HIGH (ref 11–59)

## 2015-01-05 MED ORDER — ONDANSETRON 8 MG PO TBDP: ORAL_TABLET | ORAL | Status: AC

## 2015-01-05 MED ORDER — KETOROLAC TROMETHAMINE 30 MG/ML IJ SOLN
30.0000 mg | Freq: Once | INTRAMUSCULAR | Status: AC
  Administered 2015-01-05: 30 mg via INTRAVENOUS
  Filled 2015-01-05: qty 1

## 2015-01-05 MED ORDER — ACETAMINOPHEN 500 MG PO TABS
1000.0000 mg | ORAL_TABLET | Freq: Once | ORAL | Status: AC
  Administered 2015-01-05: 1000 mg via ORAL
  Filled 2015-01-05: qty 2

## 2015-01-05 MED ORDER — IOHEXOL 300 MG/ML  SOLN
100.0000 mL | Freq: Once | INTRAMUSCULAR | Status: AC | PRN
  Administered 2015-01-05: 100 mL via INTRAVENOUS

## 2015-01-05 MED ORDER — ONDANSETRON HCL 4 MG/2ML IJ SOLN
4.0000 mg | Freq: Once | INTRAMUSCULAR | Status: AC
  Administered 2015-01-05: 4 mg via INTRAVENOUS
  Filled 2015-01-05: qty 2

## 2015-01-05 MED ORDER — IOHEXOL 300 MG/ML  SOLN
25.0000 mL | Freq: Once | INTRAMUSCULAR | Status: AC | PRN
  Administered 2015-01-05: 25 mL via ORAL

## 2015-01-05 MED ORDER — SODIUM CHLORIDE 0.9 % IV BOLUS (SEPSIS)
1000.0000 mL | Freq: Once | INTRAVENOUS | Status: AC
  Administered 2015-01-05: 1000 mL via INTRAVENOUS

## 2015-01-05 NOTE — ED Notes (Signed)
Pt vomited.   

## 2015-01-05 NOTE — ED Provider Notes (Signed)
CSN: [id number]     Arrival date & time 01/05/15  1409 History  This chart was scribed for Wyatt Lyons, MD by Wyatt Vance, ED Scribe. This patient was seen in room MH03/MH03 and the patient's care was started at 3:11 PM.    Chief Complaint  Patient presents with  . Abdominal Pain   Patient is a 29 y.o. male presenting with abdominal pain.  Abdominal Pain Pain location:  Generalized Pain quality: cramping   Pain radiates to:  Does not radiate Pain severity:  Moderate Onset quality:  Gradual Duration:  1 day Timing:  Constant Progression:  Unchanged Chronicity:  Recurrent Context: not sick contacts   Relieved by:  Nothing Worsened by:  Nothing tried Ineffective treatments:  None tried (Imodium, Zofran) Associated symptoms: diarrhea   Associated symptoms: no dysuria and no hematochezia      HPI Comments: Wyatt Vance is a 29 y.o. male who presents to the Emergency Department complaining of cramping epigastric abdominal pain, diarrhea, and vomiting that began last night. He was seen for the same symptoms 1 week ago and was prescribed Imodium and Zofran for a viral infections; his symptoms resolved for a short period of time and then returned yesterday. These symptoms are unusual for the patient. He denies a history of abdominal surgeries. Patient has a history of seasonal allergies but denies taking any medication for this; no other chronic health problems. He denies any recent antibiotics use or sick contact. He denies blood in his stool, hematochezia, difficulty urinating, or dysuria.  Past Medical History  Diagnosis Date  . Seasonal allergies    History reviewed. No pertinent past surgical history. No family history on file. History  Substance Use Topics  . Smoking status: Never Smoker   . Smokeless tobacco: Not on file  . Alcohol Use: No    Review of Systems  Gastrointestinal: Positive for abdominal pain and diarrhea. Negative for hematochezia.  Genitourinary: Negative  for dysuria.  All other systems reviewed and are negative.   Allergies  Review of patient's allergies indicates no known allergies.  Home Medications   Prior to Admission medications   Medication Sig Start Date End Date Taking? Authorizing Provider  loperamide (IMODIUM) 2 MG capsule Take 2 capsules (4 mg total) by mouth as needed for diarrhea or loose stools. 12/19/14   Wyatt Baton, MD  ondansetron (ZOFRAN-ODT) 4 MG disintegrating tablet Take 1 tablet (4 mg total) by mouth every 8 (eight) hours as needed for nausea or vomiting. 12/19/14   Wyatt Baton, MD   BP 144/89 mmHg  Pulse 118  Temp(Src) 99.6 F (37.6 C) (Oral)  Resp 20  Ht  (1.753 m)  Wt 185 lb (83.915 kg)  BMI 27.31 kg/m2  SpO2 95% Physical Exam  Constitutional: He is oriented to person, place, and time. He appears well-developed and well-nourished. No distress.  HENT:  Head: Normocephalic and atraumatic.  Right Ear: Hearing normal.  Left Ear: Hearing normal.  Nose: Nose normal.  Mouth/Throat: Oropharynx is clear and moist and mucous membranes are normal.  Eyes: Conjunctivae and EOM are normal. Pupils are equal, round, and reactive to light.  Neck: Normal range of motion. Neck supple.  Cardiovascular: Regular rhythm, S1 normal and S2 normal.  Exam reveals no gallop and no friction rub.   No murmur heard. Pulmonary/Chest: Effort normal and breath sounds normal. No respiratory distress. He exhibits no tenderness.  Abdominal: Soft. Normal appearance and bowel sounds are normal. There is no hepatosplenomegaly. There is  tenderness. There is no rebound, no guarding, no tenderness at McBurney's point and negative Murphy's sign. No hernia.  Tenderness to palpation across the upper abdomen in the right upper, left upper, and epigastrium.   Musculoskeletal: Normal range of motion.  Neurological: He is alert and oriented to person, place, and time. He has normal strength. No cranial nerve deficit or sensory deficit.  Coordination normal. GCS eye subscore is 4. GCS verbal subscore is 5. GCS motor subscore is 6.  Skin: Skin is warm, dry and intact. No rash noted. No cyanosis.  Psychiatric: He has a normal mood and affect. His speech is normal and behavior is normal. Thought content normal.  Nursing note and vitals reviewed.   ED Course  Procedures (including critical care time)  DIAGNOSTIC STUDIES: Oxygen Saturation is 95% on room air, normal by my interpretation.    COORDINATION OF CARE: 3:16 PM - Discussed treatment plan with pt at bedside which includes CT scan abdomen with contrast and nausea medication administered here, and pt agreed to plan.   Labs Review Labs Reviewed  URINALYSIS, ROUTINE W REFLEX MICROSCOPIC - Abnormal; Notable for the following:    Urobilinogen, UA 2.0 (*)    All other components within normal limits  CBC WITH DIFFERENTIAL/PLATELET - Abnormal; Notable for the following:    WBC 3.7 (*)    Lymphs Abs 0.4 (*)    All other components within normal limits  COMPREHENSIVE METABOLIC PANEL  LIPASE, BLOOD    MDM   Final diagnoses:  None     Patient presents for second time in the past 2 weeks for symptoms consistent with a viral gastroenteritis. He is complaining of pain across his upper abdomen, however laboratory studies and CT scan are reassuring. He will be discharged with Zofran, clear liquids, and when necessary return.   I personally performed the services described in this documentation, which was scribed in my presence. The recorded information has been reviewed and is accurate.      Wyatt Lyonsouglas Chivas Notz, MD 01/05/15 [phone]

## 2015-01-05 NOTE — Discharge Instructions (Signed)
Zofran as prescribed as needed for nausea.  Return to the emergency department if you develop worsening pain, bloody stool, or other new and concerning symptoms.   Viral Gastroenteritis Viral gastroenteritis is also known as stomach flu. This condition affects the stomach and intestinal tract. It can cause sudden diarrhea and vomiting. The illness typically lasts 3 to 8 days. Most people develop an immune response that eventually gets rid of the virus. While this natural response develops, the virus can make you quite ill. CAUSES  Many different viruses can cause gastroenteritis, such as rotavirus or noroviruses. You can catch one of these viruses by consuming contaminated food or water. You may also catch a virus by sharing utensils or other personal items with an infected person or by touching a contaminated surface. SYMPTOMS  The most common symptoms are diarrhea and vomiting. These problems can cause a severe loss of body fluids (dehydration) and a body salt (electrolyte) imbalance. Other symptoms may include:  Fever.  Headache.  Fatigue.  Abdominal pain. DIAGNOSIS  Your caregiver can usually diagnose viral gastroenteritis based on your symptoms and a physical exam. A stool sample may also be taken to test for the presence of viruses or other infections. TREATMENT  This illness typically goes away on its own. Treatments are aimed at rehydration. The most serious cases of viral gastroenteritis involve vomiting so severely that you are not able to keep fluids down. In these cases, fluids must be given through an intravenous line (IV). HOME CARE INSTRUCTIONS   Drink enough fluids to keep your urine clear or pale yellow. Drink small amounts of fluids frequently and increase the amounts as tolerated.  Ask your caregiver for specific rehydration instructions.  Avoid:  Foods high in sugar.  Alcohol.  Carbonated drinks.  Tobacco.  Juice.  Caffeine drinks.  Extremely hot or cold  fluids.  Fatty, greasy foods.  Too much intake of anything at one time.  Dairy products until 24 to 48 hours after diarrhea stops.  You may consume probiotics. Probiotics are active cultures of beneficial bacteria. They may lessen the amount and number of diarrheal stools in adults. Probiotics can be found in yogurt with active cultures and in supplements.  Wash your hands well to avoid spreading the virus.  Only take over-the-counter or prescription medicines for pain, discomfort, or fever as directed by your caregiver. Do not give aspirin to children. Antidiarrheal medicines are not recommended.  Ask your caregiver if you should continue to take your regular prescribed and over-the-counter medicines.  Keep all follow-up appointments as directed by your caregiver. SEEK IMMEDIATE MEDICAL CARE IF:   You are unable to keep fluids down.  You do not urinate at least once every 6 to 8 hours.  You develop shortness of breath.  You notice blood in your stool or vomit. This may look like coffee grounds.  You have abdominal pain that increases or is concentrated in one small area (localized).  You have persistent vomiting or diarrhea.  You have a fever.  The patient is a child younger than 3 months, and he or she has a fever.  The patient is a child older than 3 months, and he or she has a fever and persistent symptoms.  The patient is a child older than 3 months, and he or she has a fever and symptoms suddenly get worse.  The patient is a baby, and he or she has no tears when crying. MAKE SURE YOU:   Understand these instructions.  Will watch your condition.  Will get help right away if you are not doing well or get worse. Document Released: 08/29/2005 Document Revised: 11/21/2011 Document Reviewed: 06/15/2011 [facility] Patient Information 2015 [place], [place]. This information is not intended to replace advice given to you by your health care provider. Make sure you discuss  any questions you have with your health care provider.

## 2015-01-05 NOTE — ED Notes (Signed)
States he had a GI bug 2 weeks ago. Symptoms went away and are now coming back. Abdominal pain, diarrhea, and vomiting.

## 2015-01-10 ENCOUNTER — Emergency Department (HOSPITAL_BASED_OUTPATIENT_CLINIC_OR_DEPARTMENT_OTHER)
Admission: EM | Admit: 2015-01-10 | Discharge: 2015-01-10 | Disposition: A | Discharge status: Home / Self Care | Payer: Self-pay | Attending: Emergency Medicine | Admitting: Emergency Medicine

## 2015-01-10 ENCOUNTER — Encounter (HOSPITAL_BASED_OUTPATIENT_CLINIC_OR_DEPARTMENT_OTHER): Payer: Self-pay | Admitting: Emergency Medicine

## 2015-01-10 DIAGNOSIS — K088 Other specified disorders of teeth and supporting structures: Secondary | ICD-10-CM | POA: Insufficient documentation

## 2015-01-10 DIAGNOSIS — R1013 Epigastric pain: Secondary | ICD-10-CM | POA: Insufficient documentation

## 2015-01-10 DIAGNOSIS — K0889 Other specified disorders of teeth and supporting structures: Secondary | ICD-10-CM

## 2015-01-10 MED ORDER — SUCRALFATE 1 GM/10ML PO SUSP: 1.0000 g | Freq: Three times a day (TID) | ORAL | Status: AC

## 2015-01-10 MED ORDER — AMOXICILLIN 500 MG PO CAPS: 500.0000 mg | ORAL_CAPSULE | Freq: Three times a day (TID) | ORAL | Status: AC

## 2015-01-10 MED ORDER — RANITIDINE HCL 150 MG PO TABS: 150.0000 mg | ORAL_TABLET | Freq: Two times a day (BID) | ORAL | Status: AC

## 2015-01-10 MED ORDER — DICYCLOMINE HCL 20 MG PO TABS: 20.0000 mg | ORAL_TABLET | Freq: Two times a day (BID) | ORAL | Status: AC

## 2015-01-10 NOTE — Discharge Instructions (Signed)
Dental Pain A tooth ache may be caused by cavities (tooth decay). Cavities expose the nerve of the tooth to air and hot or cold temperatures. It may come from an infection or abscess (also called a boil or furuncle) around your tooth. It is also often caused by dental caries (tooth decay). This causes the pain you are having. DIAGNOSIS  Your caregiver can diagnose this problem by exam. TREATMENT   If caused by an infection, it may be treated with medications which kill germs (antibiotics) and pain medications as prescribed by your caregiver. Take medications as directed.  Only take over-the-counter or prescription medicines for pain, discomfort, or fever as directed by your caregiver.  Whether the tooth ache today is caused by infection or dental disease, you should see your dentist as soon as possible for further care. SEEK MEDICAL CARE IF: The exam and treatment you received today has been provided on an emergency basis only. This is not a substitute for complete medical or dental care. If your problem worsens or new problems (symptoms) appear, and you are unable to meet with your dentist, call or return to this location. SEEK IMMEDIATE MEDICAL CARE IF:   You have a fever.  You develop redness and swelling of your face, jaw, or neck.  You are unable to open your mouth.  You have severe pain uncontrolled by pain medicine. MAKE SURE YOU:   Understand these instructions.  Will watch your condition.  Will get help right away if you are not doing well or get worse. Document Released: 08/29/2005 Document Revised: 11/21/2011 Document Reviewed: 04/16/2008 [facility]ExitCare Patient Information 2015 [place]. This information is not intended to replace advice given to you by your health care provider. Make sure you discuss any questions you have with your health care provider.  Abdominal Pain Many things can cause abdominal pain. Usually, abdominal pain is not caused by a disease and will  improve without treatment. It can often be observed and treated at home. Your health care provider will do a physical exam and possibly order blood tests and X-rays to help determine the seriousness of your pain. However, in many cases, more time must pass before a clear cause of the pain can be found. Before that point, your health care provider may not know if you need more testing or further treatment. HOME CARE INSTRUCTIONS  Monitor your abdominal pain for any changes. The following actions may help to alleviate any discomfort you are experiencing:  Only take over-the-counter or prescription medicines as directed by your health care provider.  Do not take laxatives unless directed to do so by your health care provider.  Try a clear liquid diet (broth, tea, or water) as directed by your health care provider. Slowly move to a bland diet as tolerated. SEEK MEDICAL CARE IF:  You have unexplained abdominal pain.  You have abdominal pain associated with nausea or diarrhea.  You have pain when you urinate or have a bowel movement.  You experience abdominal pain that wakes you in the night.  You have abdominal pain that is worsened or improved by eating food.  You have abdominal pain that is worsened with eating fatty foods.  You have a fever. SEEK IMMEDIATE MEDICAL CARE IF:   Your pain does not go away within 2 hours.  You keep throwing up (vomiting).  Your pain is felt only in portions of the abdomen, such as the right side or the left lower portion of the abdomen.  You pass  bloody or black tarry stools. MAKE SURE YOU:  Understand these instructions.   Will watch your condition.   Will get help right away if you are not doing well or get worse.  Document Released: 06/08/2005 Document Revised: 09/03/2013 Document Reviewed: 05/08/2013 [facility] Patient Information 2015 [place], [place]. This information is not intended to replace advice given to you by your health care provider.  Make sure you discuss any questions you have with your health care provider.

## 2015-01-10 NOTE — ED Notes (Signed)
Abdominal pain for a week and swollen gums upper right side of mouth since being sick. Denies N/V/D or fevers pain 8/10.

## 2015-01-10 NOTE — ED Provider Notes (Signed)
CSN: [id number]     Arrival date & time 01/10/15  1436 History   First MD Initiated Contact with Patient 01/10/15 1446     Chief Complaint  Patient presents with  . Dental Pain  . Abdominal Pain     (Consider location/radiation/quality/duration/timing/severity/associated sxs/prior Treatment) HPI Comments: Patient presents for multiple complaints. Patient was seen nearly a week ago for abdominal pain with nausea, vomiting and diarrhea. At that time, patient had been sick for 2 weeks. Patient had a thorough workup including lab work and CT scan. No acute abnormalities were seen. He did have a fever at that time, symptoms were felt to be most likely viral in nature based on the workup. He reports that the vomiting and diarrhea has resolved but he still is experiencing intermittent episodes of severe upper abdominal cramping. He has not had any further fever.  Patient also complaining of pain on the right upper side of his mouth. He feels like the gums are swollen around the molars on that side. He has not had any drainage. There is no significant facial swelling.  Patient is a 29 y.o. male presenting with tooth pain and abdominal pain.  Dental Pain Abdominal Pain   Past Medical History  Diagnosis Date  . Seasonal allergies    History reviewed. No pertinent past surgical history. No family history on file. History  Substance Use Topics  . Smoking status: Never Smoker   . Smokeless tobacco: Not on file  . Alcohol Use: No    Review of Systems  HENT: Positive for dental problem.   Gastrointestinal: Positive for abdominal pain.  All other systems reviewed and are negative.     Allergies  Review of patient's allergies indicates no known allergies.  Home Medications   Prior to Admission medications   Medication Sig Start Date End Date Taking? Authorizing Provider  loperamide (IMODIUM) 2 MG capsule Take 2 capsules (4 mg total) by mouth as needed for diarrhea or loose stools.  12/19/14   Shon Batonourtney F Horton, MD  ondansetron (ZOFRAN ODT) 8 MG disintegrating tablet 8mg  ODT q4 hours prn nausea 01/05/15   Geoffery Lyonsouglas Delo, MD   BP 132/83 mmHg  Pulse 87  Temp(Src) 98.3 F (36.8 C) (Oral)  Resp 17  Ht 5\' 9"  (1.753 m)  Wt 185 lb (83.915 kg)  BMI 27.31 kg/m2  SpO2 100% Physical Exam  Constitutional: He is oriented to person, place, and time. He appears well-developed and well-nourished. No distress.  HENT:  Head: Normocephalic and atraumatic.  Right Ear: Hearing normal.  Left Ear: Hearing normal.  Nose: Nose normal.  Mouth/Throat: Oropharynx is clear and moist and mucous membranes are normal.    Eyes: Conjunctivae and EOM are normal. Pupils are equal, round, and reactive to light.  Neck: Normal range of motion. Neck supple.  Cardiovascular: Regular rhythm, S1 normal and S2 normal.  Exam reveals no gallop and no friction rub.   No murmur heard. Pulmonary/Chest: Effort normal and breath sounds normal. No respiratory distress. He exhibits no tenderness.  Abdominal: Soft. Normal appearance and bowel sounds are normal. There is no hepatosplenomegaly. There is tenderness in the epigastric area. There is no rebound, no guarding, no tenderness at McBurney's point and negative Murphy's sign. No hernia.  Musculoskeletal: Normal range of motion.  Neurological: He is alert and oriented to person, place, and time. He has normal strength. No cranial nerve deficit or sensory deficit. Coordination normal. GCS eye subscore is 4. GCS verbal subscore is 5. GCS motor subscore  is 6.  Skin: Skin is warm, dry and intact. No rash noted. No cyanosis.  Psychiatric: He has a normal mood and affect. His speech is normal and behavior is normal. Thought content normal.  Nursing note and vitals reviewed.   ED Course  Procedures (including critical care time) Labs Review Labs Reviewed - No data to display  Imaging Review No results found.   EKG Interpretation None      MDM   Final  diagnoses:  None   toothache  abdominal pain  Patient presents to the ER for evaluation of continued abdominal pain. Patient was seen 5 days ago with similar pain but at that time was having vomiting and diarrhea with a fever. His workup was entirely normal. Vomiting and diarrhea have resolved. Patient continues to have pain in the epigastric region. He is not tender in the right upper quadrant suggest gallbladder disease. CT scan did not show a gallstone or any inflammatory changes. Pain might be residual from his initial illness, but cannot rule out peptic ulcer disease or GERD. Will treat empirically for these entities, refer to GI.  Patient complaining of pain and swelling of the right side of his mouth. There is no obvious dental abscess but he does have pain in the gums surrounding the right upper molars with some tenderness of the teeth. Will treat with amoxicillin.    Gilda Crease, MD 01/10/15 [phone]

## 2016-02-09 ENCOUNTER — Encounter (HOSPITAL_BASED_OUTPATIENT_CLINIC_OR_DEPARTMENT_OTHER): Payer: Self-pay

## 2016-02-09 ENCOUNTER — Emergency Department (HOSPITAL_BASED_OUTPATIENT_CLINIC_OR_DEPARTMENT_OTHER)
Admission: EM | Admit: 2016-02-09 | Discharge: 2016-02-09 | Disposition: A | Discharge status: Home / Self Care | Payer: Self-pay | Attending: Emergency Medicine | Admitting: Emergency Medicine

## 2016-02-09 DIAGNOSIS — R197 Diarrhea, unspecified: Secondary | ICD-10-CM

## 2016-02-09 DIAGNOSIS — B349 Viral infection, unspecified: Secondary | ICD-10-CM | POA: Insufficient documentation

## 2016-02-09 DIAGNOSIS — R112 Nausea with vomiting, unspecified: Secondary | ICD-10-CM

## 2016-02-09 LAB — COMPREHENSIVE METABOLIC PANEL
ALBUMIN: 4.1 g/dL (ref 3.5–5.0)
ALT: 17 U/L (ref 17–63)
AST: 20 U/L (ref 15–41)
Alkaline Phosphatase: 77 U/L (ref 38–126)
Anion gap: 10 (ref 5–15)
BILIRUBIN TOTAL: 0.4 mg/dL (ref 0.3–1.2)
BUN: 10 mg/dL (ref 6–20)
CO2: 24 mmol/L (ref 22–32)
Calcium: 9.1 mg/dL (ref 8.9–10.3)
Chloride: 103 mmol/L (ref 101–111)
Creatinine, Ser: 1.02 mg/dL (ref 0.61–1.24)
GFR calc Af Amer: >60 mL/min (ref >60)
GFR calc non Af Amer: >60 mL/min (ref >60)
GLUCOSE: 95 mg/dL (ref 65–99)
POTASSIUM: 3.9 mmol/L (ref 3.5–5.1)
Sodium: 137 mmol/L (ref 135–145)
Total Protein: 7.8 g/dL (ref 6.5–8.1)

## 2016-02-09 LAB — CBC WITH DIFFERENTIAL/PLATELET
BASOS ABS: 0 10*3/uL (ref 0.0–0.1)
Basophils Relative: 0 %
Eosinophils Absolute: 0 10*3/uL (ref 0.0–0.7)
Eosinophils Relative: 1 %
HEMATOCRIT: 43.5 % (ref 39.0–52.0)
HEMOGLOBIN: 15.1 g/dL (ref 13.0–17.0)
LYMPHS PCT: 29 %
Lymphs Abs: 0.8 10*3/uL (ref 0.7–4.0)
MCH: 26.9 pg (ref 26.0–34.0)
MCHC: 34.7 g/dL (ref 30.0–36.0)
MCV: 77.5 fL — ABNORMAL LOW (ref 78.0–100.0)
MONO ABS: 0.5 10*3/uL (ref 0.1–1.0)
Monocytes Relative: 18 %
NEUTROS ABS: 1.4 10*3/uL — AB (ref 1.7–7.7)
NEUTROS PCT: 52 %
Platelets: 171 10*3/uL (ref 150–400)
RBC: 5.61 MIL/uL (ref 4.22–5.81)
RDW: 13.5 % (ref 11.5–15.5)
WBC: 2.7 10*3/uL — ABNORMAL LOW (ref 4.0–10.5)

## 2016-02-09 LAB — URINALYSIS, ROUTINE W REFLEX MICROSCOPIC
Bilirubin Urine: NEGATIVE
GLUCOSE, UA: NEGATIVE mg/dL
Hgb urine dipstick: NEGATIVE
KETONES UR: NEGATIVE mg/dL
LEUKOCYTES UA: NEGATIVE
Nitrite: NEGATIVE
PH: 5.5 (ref 5.0–8.0)
Protein, ur: NEGATIVE mg/dL
Specific Gravity, Urine: 1.021 (ref 1.005–1.030)

## 2016-02-09 LAB — LIPASE, BLOOD: LIPASE: 33 U/L (ref 11–51)

## 2016-02-09 LAB — RAPID STREP SCREEN (MED CTR MEBANE ONLY): STREPTOCOCCUS, GROUP A SCREEN (DIRECT): NEGATIVE

## 2016-02-09 MED ORDER — LOPERAMIDE HCL 2 MG PO CAPS: 2.0000 mg | ORAL_CAPSULE | Freq: Four times a day (QID) | ORAL | Status: AC | PRN

## 2016-02-09 MED ORDER — SODIUM CHLORIDE 0.9 % IV BOLUS (SEPSIS)
1000.0000 mL | Freq: Once | INTRAVENOUS | Status: AC
  Administered 2016-02-09: 1000 mL via INTRAVENOUS

## 2016-02-09 MED ORDER — ONDANSETRON HCL 4 MG/2ML IJ SOLN
4.0000 mg | Freq: Once | INTRAMUSCULAR | Status: AC
  Administered 2016-02-09: 4 mg via INTRAVENOUS
  Filled 2016-02-09: qty 2

## 2016-02-09 MED ORDER — ONDANSETRON HCL 4 MG PO TABS: 4.0000 mg | ORAL_TABLET | Freq: Four times a day (QID) | ORAL | Status: AC

## 2016-02-09 NOTE — ED Notes (Signed)
C/o sore throat left upper abd pain x n/v x 2 -3,  Diarrhea onset 5 days ago

## 2016-02-09 NOTE — ED Provider Notes (Signed)
CSN: [id number]     Arrival date & time 02/09/16  1915 History   First MD Initiated Contact with Patient 02/09/16 1921     Chief Complaint  Patient presents with  . Emesis     (Consider location/radiation/quality/duration/timing/severity/associated sxs/prior Treatment) HPI Wyatt Vance is a 30 y.o. male who is otherwise healthy here for evaluation of nausea, vomiting, diarrhea, sore throat and cough since Thursday. He reports symptoms were gradual in onset. Emesis is nonbloody and nonbilious. He reports roughly 6 episodes of diarrhea. Denies any melena or hematochezia. Reports only mild, diffuse abdominal discomfort. He has taken TheraFlu without relief of his symptoms. Denies any sick contacts, fevers or chills, urinary symptoms, skull pain or penile discharge.  Past Medical History  Diagnosis Date  . Seasonal allergies    History reviewed. No pertinent past surgical history. No family history on file. Social History  Substance Use Topics  . Smoking status: Never Smoker   . Smokeless tobacco: None  . Alcohol Use: No    Review of Systems A 10 point review of systems was completed and was negative except for pertinent positives and negatives as mentioned in the history of present illness     Allergies  Review of patient's allergies indicates no known allergies.  Home Medications   Prior to Admission medications   Medication Sig Start Date End Date Taking? Authorizing Provider  amoxicillin (AMOXIL) 500 MG capsule Take 1 capsule (500 mg total) by mouth 3 (three) times daily. 01/10/15   Gilda Crease, MD  dicyclomine (BENTYL) 20 MG tablet Take 1 tablet (20 mg total) by mouth 2 (two) times daily. 01/10/15   Gilda Crease, MD  loperamide (IMODIUM) 2 MG capsule Take 1 capsule (2 mg total) by mouth 4 (four) times daily as needed for diarrhea or loose stools. 02/09/16   Wyatt Peek, PA-C  ondansetron (ZOFRAN ODT) 8 MG disintegrating tablet  ODT q4 hours prn  nausea 01/05/15   Geoffery Lyons, MD  ondansetron (ZOFRAN) 4 MG tablet Take 1 tablet (4 mg total) by mouth every 6 (six) hours. 02/09/16   Wyatt Peek, PA-C  ranitidine (ZANTAC) 150 MG tablet Take 1 tablet (150 mg total) by mouth 2 (two) times daily. 01/10/15   Gilda Crease, MD  sucralfate (CARAFATE) 1 GM/10ML suspension Take 10 mLs (1 g total) by mouth 4 (four) times daily -  with meals and at bedtime. 01/10/15   Gilda Crease, MD   BP 142/79 mmHg  Pulse 104  Temp(Src) 98.9 F (37.2 C) (Oral)  Resp 18  Ht  (1.753 m)  Wt 96.616 kg  BMI 31.44 kg/m2  SpO2 98% Physical Exam  Constitutional: He is oriented to person, place, and time. He appears well-developed and well-nourished.  HENT:  Head: Normocephalic and atraumatic.  Mouth/Throat: Oropharynx is clear and moist.  Eyes: Conjunctivae are normal. Pupils are equal, round, and reactive to light. Right eye exhibits no discharge. Left eye exhibits no discharge. No scleral icterus.  Neck: Neck supple.  Cardiovascular: Normal rate, regular rhythm and normal heart sounds.   No tachycardia on my exam, heart rate in the 90s  Pulmonary/Chest: Effort normal and breath sounds normal. No respiratory distress. He has no wheezes. He has no rales.  Abdominal: Soft.  Abdomen is soft, nondistended without rebound or guarding. Very mild, diffuse tenderness and no peritoneal signs.  Musculoskeletal: He exhibits no tenderness.  Neurological: He is alert and oriented to person, place, and time.  Cranial Nerves II-XII grossly  intact  Skin: Skin is warm and dry. No rash noted.  Psychiatric: He has a normal mood and affect.  Nursing note and vitals reviewed.   ED Course  Procedures (including critical care time) Labs Review Labs Reviewed  CBC WITH DIFFERENTIAL/PLATELET - Abnormal; Notable for the following:    WBC 2.7 (*)    MCV 77.5 (*)    Neutro Abs 1.4 (*)    All other components within normal limits  RAPID STREP SCREEN (NOT  AT [facility])  CULTURE, GROUP A STREP [facility])  COMPREHENSIVE METABOLIC PANEL  LIPASE, BLOOD  URINALYSIS, ROUTINE W REFLEX MICROSCOPIC (NOT AT [facility])    Imaging Review No results found. I have personally reviewed and evaluated these images and lab results as part of my medical decision-making.   EKG Interpretation None     Meds given in ED:  Medications  ondansetron [facility](ZOFRAN) injection 4 mg (4 mg Intravenous Given 02/09/16 2003)  sodium chloride 0.9 % bolus 1,000 mL (0 mLs Intravenous Stopped 02/09/16 2133)    Discharge Medication List as of 02/09/2016  9:32 PM    START taking these medications   Details  ondansetron (ZOFRAN) 4 MG tablet Take 1 tablet (4 mg total) by mouth every 6 (six) hours., Starting 02/09/2016, Until Discontinued, Print       Filed Vitals:   02/09/16 1922 02/09/16 2135  BP: 133/93 142/79  Pulse: 119 104  Temp: 98.6 F (37 C) 98.9 F (37.2 C)  TempSrc: Oral Oral  Resp: 18 18  Height: 5\' 9"  (1.753 m)   Weight: 96.616 kg   SpO2: 98% 98%    MDM  Wyatt Vance is a 30 y.o. male with no significant past medical history here for nausea, vomiting, diarrhea, sore throat and cough. Patient is hemodynamically stable and afebrile. Initial tachycardia likely erroneous, heart rate of my exam was mid 90s. However, screening labs obtained and fluid bolus given. He has an underwhelming abdominal exam and overall unremarkable physical exam. No emesis, diarrhea and emergency department. Screening labs are unremarkable. He is tolerating oral fluids. Discussed follow-up with PCP next week. Plan to DC with Zofran for nausea, loperamide for diarrhea. Also encouraged aggressive oral rehydration at home with electrolyte balance solution. Patient and fianc at bedside verbalize understanding and agree with this plan. Final diagnoses:  Nausea vomiting and diarrhea  Viral illness       Wyatt PeekBenjamin Najee Manninen, PA-C 02/09/16 2223  Jacalyn LefevreJulie Haviland, MD 02/10/16 [phone]

## 2016-02-09 NOTE — ED Notes (Signed)
C/o n/v/d, sore throat since last Thursday-NAD-steady gait

## 2016-02-09 NOTE — Discharge Instructions (Signed)
There does not appear to be an emergent cause for your symptoms at this time. Your exam, labs were very reassuring. Please take your medication as prescribed for your nausea and diarrhea. Please drink lots of fluids and follow-up with your PCP next week. Return to ED for any new or worsening symptoms as we discussed.  Nausea and Vomiting Nausea means you feel sick to your stomach. Throwing up (vomiting) is a reflex where stomach contents come out of your mouth. HOME CARE   Take medicine as told by your doctor.  Do not force yourself to eat. However, you do need to drink fluids.  If you feel like eating, eat a normal diet as told by your doctor.  Eat rice, wheat, potatoes, bread, lean meats, yogurt, fruits, and vegetables.  Avoid high-fat foods.  Drink enough fluids to keep your pee (urine) clear or pale yellow.  Ask your doctor how to replace body fluid losses (rehydrate). Signs of body fluid loss (dehydration) include:  Feeling very thirsty.  Dry lips and mouth.  Feeling dizzy.  Dark pee.  Peeing less than normal.  Feeling confused.  Fast breathing or heart rate. GET HELP RIGHT AWAY IF:   You have blood in your throw up.  You have black or bloody poop (stool).  You have a bad headache or stiff neck.  You feel confused.  You have bad belly (abdominal) pain.  You have chest pain or trouble breathing.  You do not pee at least once every 8 hours.  You have cold, clammy skin.  You keep throwing up after 24 to 48 hours.  You have a fever. MAKE SURE YOU:   Understand these instructions.  Will watch your condition.  Will get help right away if you are not doing well or get worse.   This information is not intended to replace advice given to you by your health care provider. Make sure you discuss any questions you have with your health care provider.   Document Released: 02/15/2008 Document Revised: 11/21/2011 Document Reviewed: 01/28/2011 Elsevier Interactive  Patient Education [employer]2016 [employer] Inc.

## 2016-02-12 LAB — CULTURE, GROUP A STREP (THRC)

## 2017-02-05 ENCOUNTER — Encounter (HOSPITAL_BASED_OUTPATIENT_CLINIC_OR_DEPARTMENT_OTHER): Payer: Self-pay | Admitting: *Deleted

## 2017-02-05 ENCOUNTER — Emergency Department (HOSPITAL_BASED_OUTPATIENT_CLINIC_OR_DEPARTMENT_OTHER)
Admission: EM | Admit: 2017-02-05 | Discharge: 2017-02-05 | Disposition: A | Discharge status: Home / Self Care | Payer: Self-pay | Attending: Physician Assistant | Admitting: Physician Assistant

## 2017-02-05 ENCOUNTER — Emergency Department (HOSPITAL_BASED_OUTPATIENT_CLINIC_OR_DEPARTMENT_OTHER): Payer: Self-pay

## 2017-02-05 DIAGNOSIS — M79672 Pain in left foot: Secondary | ICD-10-CM | POA: Insufficient documentation

## 2017-02-05 MED ORDER — IBUPROFEN 800 MG PO TABS: 800.0000 mg | ORAL_TABLET | Freq: Three times a day (TID) | ORAL | 0 refills | Status: AC

## 2017-02-05 MED ORDER — IBUPROFEN 800 MG PO TABS
800.0000 mg | ORAL_TABLET | Freq: Once | ORAL | Status: AC
  Administered 2017-02-05: 800 mg via ORAL
  Filled 2017-02-05: qty 1

## 2017-02-05 NOTE — Discharge Instructions (Signed)
Please use inserts on your shoes, and rest your foot for 1-2 days to help with any overuse injury you may have. You may use ibuprofen to helpw with the pain.

## 2017-02-05 NOTE — ED Provider Notes (Signed)
[facility] Provider Note   CSN: [id number] Arrival date & time: 02/05/17  [id number]     History   Chief Complaint Chief Complaint  Patient presents with  . Foot Pain    left    HPI Wyatt Vance is a 31 y.o. male.  HPI   31 year old male presenting with left foot pain. Patient has an aching in his left heel. This started 3 days ago. Worse with walking. No known injury. Has not been wearing new shoes.  Past Medical History:  Diagnosis Date  . Seasonal allergies     There are no active problems to display for this patient.   History reviewed. No pertinent surgical history.     Home Medications    Prior to Admission medications   Medication Sig Start Date End Date Taking? Authorizing Provider  amoxicillin (AMOXIL) 500 MG capsule Take 1 capsule (500 mg total) by mouth 3 (three) times daily. 01/10/15   Gilda CreasePollina, Christopher J, MD  dicyclomine (BENTYL) 20 MG tablet Take 1 tablet (20 mg total) by mouth 2 (two) times daily. 01/10/15   Gilda CreasePollina, Christopher J, MD  ibuprofen (ADVIL,MOTRIN) 800 MG tablet Take 1 tablet (800 mg total) by mouth 3 (three) times daily. 02/05/17   Mackuen, Courteney Lyn, MD  loperamide (IMODIUM) 2 MG capsule Take 1 capsule (2 mg total) by mouth 4 (four) times daily as needed for diarrhea or loose stools. 02/09/16   Cartner, Sharlet SalinaBenjamin, PA-C  ondansetron (ZOFRAN ODT) 8 MG disintegrating tablet 8mg  ODT q4 hours prn nausea 01/05/15   Geoffery Lyonselo, Douglas, MD  ondansetron (ZOFRAN) 4 MG tablet Take 1 tablet (4 mg total) by mouth every 6 (six) hours. 02/09/16   Cartner, Sharlet SalinaBenjamin, PA-C  ranitidine (ZANTAC) 150 MG tablet Take 1 tablet (150 mg total) by mouth 2 (two) times daily. 01/10/15   Gilda CreasePollina, Christopher J, MD  sucralfate (CARAFATE) 1 GM/10ML suspension Take 10 mLs (1 g total) by mouth 4 (four) times daily -  with meals and at bedtime. 01/10/15   Gilda CreasePollina, Christopher J, MD    Family History No family history on file.  Social History Social History    Substance Use Topics  . Smoking status: Never Smoker  . Smokeless tobacco: Never Used  . Alcohol use No     Allergies   Patient has no known allergies.   Review of Systems Review of Systems  Constitutional: Negative for fever.  Musculoskeletal: Negative for back pain and gait problem.     Physical Exam Updated Vital Signs BP (!) 144/109 (BP Location: Right Arm)   Pulse 72   Temp 97.8 F (36.6 C) (Oral)   Resp 20   Ht 5\' 9"  (1.753 m)   Wt 99.8 kg (220 lb)   SpO2 100%   BMI 32.49 kg/m   Physical Exam  Constitutional: He appears well-developed and well-nourished.  HENT:  Head: Normocephalic and atraumatic.  Eyes: Conjunctivae are normal.  Cardiovascular: Normal rate and regular rhythm.   No murmur heard. Pulmonary/Chest: Effort normal. No respiratory distress.  Musculoskeletal: He exhibits no edema.  Left foot appears normal. No erythema and no swelling no bruising. Mild tenderness to the talar bone. No tenderness along the plantar aspect. Full range of motion and strength and sensation intact.  Neurological: He is alert.  Skin: Skin is warm and dry.  Psychiatric: He has a normal mood and affect.  Nursing note and vitals reviewed.    ED Treatments / Results  Labs (all labs ordered are listed, but only  abnormal results are displayed) Labs Reviewed - No data to display  EKG  EKG Interpretation None       Radiology Dg Foot Complete Left  Result Date: 02/05/2017 CLINICAL DATA:  Left heel pain, no known injury EXAM: LEFT FOOT - COMPLETE 3+ VIEW COMPARISON:  None. FINDINGS: No fracture or dislocation is seen. The joint spaces are preserved. The visualized soft tissues are unremarkable. Small posterior calcaneal enthesophyte. IMPRESSION: Negative. Electronically Signed   By: Charline Bills M.D.   On: 02/05/2017 07:32    Procedures Procedures (including critical care time)  Medications Ordered in ED Medications  ibuprofen (ADVIL,MOTRIN) tablet 800 mg  (800 mg Oral Given 02/05/17 0731)     Initial Impression / Assessment and Plan / ED Course  I have reviewed the triage vital signs and the nursing notes.  Pertinent labs & imaging results that were available during my care of the patient were reviewed by me and considered in my medical decision making (see chart for details).     Patient here with nontraumatic foot pain. No evidence of swelling erythema. Do not suspect septic joint, or other injury. Mild tenderness with palpation of the talar bone. We'll get x-ray to make sure there is no occult fracture. Otherwise we'll treat with encouraging inserts, good footwear and follow-up with primary care.  Patient was discharged but then asked for crutches. Crutches provided with Ace bandage.  Final Clinical Impressions(s) / ED Diagnoses   Final diagnoses:  Foot pain, left    New Prescriptions Discharge Medication List as of 02/05/2017  7:40 AM    START taking these medications   Details  ibuprofen (ADVIL,MOTRIN) 800 MG tablet Take 1 tablet (800 mg total) by mouth 3 (three) times daily., Starting Sun 02/05/2017, Print         Mackuen, Cindee Salt, MD 02/05/17 [phone]

## 2017-02-05 NOTE — ED Triage Notes (Signed)
Patient states he developed left foot pain two days ago.  No known injury.

## 2019-02-19 ENCOUNTER — Emergency Department (HOSPITAL_BASED_OUTPATIENT_CLINIC_OR_DEPARTMENT_OTHER)
Admission: EM | Admit: 2019-02-19 | Discharge: 2019-02-19 | Disposition: A | Discharge status: Home / Self Care | Payer: PRIVATE HEALTH INSURANCE | Attending: Emergency Medicine | Admitting: Emergency Medicine

## 2019-02-19 ENCOUNTER — Other Ambulatory Visit: Payer: Self-pay

## 2019-02-19 ENCOUNTER — Encounter (HOSPITAL_BASED_OUTPATIENT_CLINIC_OR_DEPARTMENT_OTHER): Payer: Self-pay | Admitting: Emergency Medicine

## 2019-02-19 DIAGNOSIS — R51 Headache: Secondary | ICD-10-CM | POA: Diagnosis present

## 2019-02-19 DIAGNOSIS — G4489 Other headache syndrome: Secondary | ICD-10-CM | POA: Insufficient documentation

## 2019-02-19 DIAGNOSIS — Z79899 Other long term (current) drug therapy: Secondary | ICD-10-CM | POA: Insufficient documentation

## 2019-02-19 MED ORDER — ONDANSETRON 8 MG PO TBDP
8.0000 mg | ORAL_TABLET | Freq: Once | ORAL | Status: AC
  Administered 2019-02-19: 8 mg via ORAL
  Filled 2019-02-19: qty 1

## 2019-02-19 MED ORDER — DIVALPROEX SODIUM 500 MG PO DR TAB
500.0000 mg | DELAYED_RELEASE_TABLET | Freq: Two times a day (BID) | ORAL | Status: DC
  Filled 2019-02-19: qty 1

## 2019-02-19 MED ORDER — DIPHENHYDRAMINE HCL 50 MG/ML IJ SOLN: 12.5000 mg | Freq: Once | INTRAMUSCULAR | Status: DC

## 2019-02-19 MED ORDER — KETOROLAC TROMETHAMINE 30 MG/ML IJ SOLN: 15.0000 mg | Freq: Once | INTRAMUSCULAR | Status: DC

## 2019-02-19 MED ORDER — KETOROLAC TROMETHAMINE 60 MG/2ML IM SOLN
30.0000 mg | Freq: Once | INTRAMUSCULAR | Status: AC
  Administered 2019-02-19: 30 mg via INTRAMUSCULAR
  Filled 2019-02-19: qty 2

## 2019-02-19 MED ORDER — METOCLOPRAMIDE HCL 5 MG/ML IJ SOLN: 10.0000 mg | Freq: Once | INTRAMUSCULAR | Status: DC

## 2019-02-19 NOTE — ED Notes (Signed)
Patient given fluid in PO Challenge per provider.

## 2019-02-19 NOTE — ED Triage Notes (Signed)
Patient arrived via POV c/o headache x 3 days with episodes of emesis. Patient expresses hx of sinus headaches. Patient last took tylenol at approximately 2000. Patient is AO x 4 with VS WDL.

## 2019-02-19 NOTE — ED Provider Notes (Signed)
[facility] EMERGENCY DEPARTMENT Provider Note   CSN: [id number] Arrival date & time: 02/19/19  [id number]    History   Chief Complaint No chief complaint on file.   HPI Wyatt Vance is a 33 y.o. male.     The history is provided by the patient.  Headache  Pain location:  Frontal Quality:  Sharp Radiates to:  Does not radiate Onset quality:  Gradual Duration:  3 days Timing:  Constant Progression:  Unchanged Chronicity:  Recurrent Similar to prior headaches: yes   Context: not activity, not exposure to bright light, not caffeine, not coughing, not defecating, not eating, not stress, not exposure to cold air, not intercourse, not loud noise and not straining   Relieved by:  Nothing Worsened by:  Nothing Ineffective treatments:  Acetaminophen Associated symptoms: no abdominal pain, no back pain, no blurred vision, no congestion, no cough, no diarrhea, no dizziness, no drainage, no ear pain, no facial pain, no fatigue, no fever, no focal weakness, no hearing loss, no loss of balance, no myalgias, no nausea, no near-syncope, no neck pain, no neck stiffness, no numbness, no paresthesias, no photophobia, no seizures, no sinus pressure, no sore throat, no swollen glands, no syncope, no tingling, no URI, no visual change, no vomiting and no weakness   Risk factors: no family hx of SAH     Past Medical History:  Diagnosis Date  . Seasonal allergies     There are no active problems to display for this patient.   History reviewed. No pertinent surgical history.      Home Medications    Prior to Admission medications   Medication Sig Start Date End Date Taking? Authorizing Provider  amoxicillin (AMOXIL) 500 MG capsule Take 1 capsule (500 mg total) by mouth 3 (three) times daily. 01/10/15   Gilda CreasePollina, Christopher J, MD  dicyclomine (BENTYL) 20 MG tablet Take 1 tablet (20 mg total) by mouth 2 (two) times daily. 01/10/15   Gilda CreasePollina, Christopher J, MD  ibuprofen (ADVIL,MOTRIN)  800 MG tablet Take 1 tablet (800 mg total) by mouth 3 (three) times daily. 02/05/17   Mackuen, Courteney Lyn, MD  loperamide (IMODIUM) 2 MG capsule Take 1 capsule (2 mg total) by mouth 4 (four) times daily as needed for diarrhea or loose stools. 02/09/16   Cartner, Sharlet SalinaBenjamin, PA-C  ondansetron (ZOFRAN ODT) 8 MG disintegrating tablet 8mg  ODT q4 hours prn nausea 01/05/15   Geoffery Lyonselo, Douglas, MD  ondansetron (ZOFRAN) 4 MG tablet Take 1 tablet (4 mg total) by mouth every 6 (six) hours. 02/09/16   Cartner, Sharlet SalinaBenjamin, PA-C  ranitidine (ZANTAC) 150 MG tablet Take 1 tablet (150 mg total) by mouth 2 (two) times daily. 01/10/15   Gilda CreasePollina, Christopher J, MD  sucralfate (CARAFATE) 1 GM/10ML suspension Take 10 mLs (1 g total) by mouth 4 (four) times daily -  with meals and at bedtime. 01/10/15   Gilda CreasePollina, Christopher J, MD    Family History No family history on file.  Social History Social History   Tobacco Use  . Smoking status: Never Smoker  . Smokeless tobacco: Never Used  Substance Use Topics  . Alcohol use: No  . Drug use: No     Allergies   Patient has no known allergies.   Review of Systems Review of Systems  Constitutional: Negative for fatigue and fever.  HENT: Negative for congestion, ear pain, hearing loss, postnasal drip, sinus pressure and sore throat.   Eyes: Negative for blurred vision, photophobia and visual disturbance.  Respiratory:  Negative for cough and shortness of breath.   Cardiovascular: Negative for chest pain, syncope and near-syncope.  Gastrointestinal: Negative for abdominal pain, diarrhea, nausea and vomiting.  Musculoskeletal: Negative for back pain, myalgias, neck pain and neck stiffness.  Neurological: Positive for headaches. Negative for dizziness, tremors, focal weakness, seizures, syncope, facial asymmetry, speech difficulty, weakness, light-headedness, numbness, paresthesias and loss of balance.  All other systems reviewed and are negative.    Physical Exam Updated  Vital Signs BP (!) 152/92   Pulse (!) 104   Temp 98.4 F (36.9 C) (Oral)   Resp 16   Ht 5\' 9"  (1.753 m)   Wt 99.8 kg   SpO2 97%   BMI 32.49 kg/m   Physical Exam Vitals signs and nursing note reviewed.  Constitutional:      General: He is not in acute distress.    Appearance: He is normal weight.  HENT:     Head: Normocephalic and atraumatic.     Nose: Nose normal.  Eyes:     Extraocular Movements: Extraocular movements intact.     Conjunctiva/sclera: Conjunctivae normal.     Pupils: Pupils are equal, round, and reactive to light.     Comments: No proptosis, intact cognition  Neck:     Musculoskeletal: Normal range of motion and neck supple.  Cardiovascular:     Rate and Rhythm: Normal rate and regular rhythm.     Pulses: Normal pulses.     Heart sounds: Normal heart sounds.  Pulmonary:     Effort: Pulmonary effort is normal.     Breath sounds: Normal breath sounds.  Abdominal:     General: Abdomen is flat. Bowel sounds are normal.     Tenderness: There is no abdominal tenderness. There is no guarding or rebound.  Musculoskeletal: Normal range of motion.  Skin:    General: Skin is warm and dry.     Capillary Refill: Capillary refill takes less than 2 seconds.  Neurological:     General: No focal deficit present.     Mental Status: He is alert and oriented to person, place, and time.     Cranial Nerves: No cranial nerve deficit.  Psychiatric:        Mood and Affect: Mood normal.      ED Treatments / Results  Labs (all labs ordered are listed, but only abnormal results are displayed) Labs Reviewed - No data to display  EKG None  Radiology No results found.  Procedures Procedures (including critical care time)  Medications Ordered in ED Medications  ketorolac (TORADOL) injection 30 mg (30 mg Intramuscular Given 02/19/19 0431)  ondansetron (ZOFRAN-ODT) disintegrating tablet 8 mg (8 mg Oral Given 02/19/19 0431)     No signs of intracranial infection nor  bleed nor cavernous sinus thrombosis.  I do not believe this patient needs imaging or labs at this time.  This is a typical headache for him.  He is pain free post medication.   Final Clinical Impressions(s) / ED Diagnoses   Return for intractable cough, coughing up blood,fevers >100.4 unrelieved by medication, shortness of breath, intractable vomiting, chest pain, shortness of breath, weakness,numbness, changes in speech, facial asymmetry,abdominal pain, passing out,Inability to tolerate liquids or food, cough, altered mental status or any concerns. No signs of systemic illness or infection. The patient is nontoxic-appearing on exam and vital signs are within normal limits.   I have reviewed the triage vital signs and the nursing notes. Pertinent labs &imaging results that were available during my  care of the patient were reviewed by me and considered in my medical decision making (see chart for details).  After history, exam, and medical workup I feel the patient has been appropriately medically screened and is safe for discharge home. Pertinent diagnoses were discussed with the patient. Patient was given return precautions   Sulay Brymer, MD 02/19/19 0981

## 2022-01-03 ENCOUNTER — Other Ambulatory Visit: Payer: Self-pay

## 2022-01-03 ENCOUNTER — Emergency Department (HOSPITAL_BASED_OUTPATIENT_CLINIC_OR_DEPARTMENT_OTHER): Payer: PRIVATE HEALTH INSURANCE

## 2022-01-03 ENCOUNTER — Emergency Department (HOSPITAL_BASED_OUTPATIENT_CLINIC_OR_DEPARTMENT_OTHER)
Admission: EM | Admit: 2022-01-03 | Discharge: 2022-01-03 | Disposition: A | Discharge status: Home / Self Care | Payer: PRIVATE HEALTH INSURANCE | Attending: Emergency Medicine | Admitting: Emergency Medicine

## 2022-01-03 ENCOUNTER — Encounter (HOSPITAL_BASED_OUTPATIENT_CLINIC_OR_DEPARTMENT_OTHER): Payer: Self-pay | Admitting: Emergency Medicine

## 2022-01-03 DIAGNOSIS — M25522 Pain in left elbow: Secondary | ICD-10-CM | POA: Insufficient documentation

## 2022-01-03 MED ORDER — KETOROLAC TROMETHAMINE 15 MG/ML IJ SOLN
15.0000 mg | Freq: Once | INTRAMUSCULAR | Status: AC
  Administered 2022-01-03: 15 mg via INTRAMUSCULAR
  Filled 2022-01-03: qty 1

## 2022-01-03 MED ORDER — KETOROLAC TROMETHAMINE 60 MG/2ML IM SOLN
15.0000 mg | Freq: Once | INTRAMUSCULAR | Status: DC
Start: 2022-01-03 — End: 2022-01-03

## 2022-01-03 NOTE — ED Triage Notes (Signed)
LEFT ELBOW PAIN  AND SWELLING X 1 WEEK , has been taking motrin , and lido pads not helping alot ?

## 2022-01-03 NOTE — Discharge Instructions (Addendum)
Your x-ray showed that the bone could be wearing away due to a decreased blood supply.  This is something that the orthopedist usually would follow you up for to see if anything could be potentially done.  Please follow-up with your family doctor as well. ? ?Take 4 over the counter ibuprofen tablets 3 times a day or 2 over-the-counter naproxen tablets twice a day for pain. ?Also take tylenol 1000mg (2 extra strength) four times a day.  ? ? ?

## 2022-01-03 NOTE — ED Notes (Signed)
Pt NAD, a/ox4. Pt verbalizes understanding of all DC and f/u instructions. All questions answered. Pt walks with steady gait to lobby at DC.  ? ?

## 2022-01-03 NOTE — ED Provider Notes (Signed)
?[facility] EMERGENCY DEPARTMENT ?Provider Note ? ? ?CSN: [id number] ?Arrival date & time: 01/03/22  1808 ? ?  ? ?History ? ?Chief Complaint  ?Patient presents with  ? Arm Pain  ? ? ?Wyatt Vance is a 36 y.o. male. ? ?36 yo M with a chief complaints of left elbow pain.  Is been going on for a few weeks now.  Denies injury.  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  Is seen an orthopedic surgeon early in his life who did not suggest surgery.  Has pain mostly to the lateral aspect of the elbow. ? ? ?Arm Pain ? ? ?  ? ?Home Medications ?Prior to Admission medications   ?Medication Sig Start Date End Date Taking? Authorizing Provider  ?amoxicillin (AMOXIL) 500 MG capsule Take 1 capsule (500 mg total) by mouth 3 (three) times daily. 01/10/15   Gilda Crease, MD  ?dicyclomine (BENTYL) 20 MG tablet Take 1 tablet (20 mg total) by mouth 2 (two) times daily. 01/10/15   Gilda Crease, MD  ?ibuprofen (ADVIL,MOTRIN) 800 MG tablet Take 1 tablet (800 mg total) by mouth 3 (three) times daily. 02/05/17   Mackuen, Courteney Lyn, MD  ?loperamide (IMODIUM) 2 MG capsule Take 1 capsule (2 mg total) by mouth 4 (four) times daily as needed for diarrhea or loose stools. 02/09/16   Joycie Peek, PA-C  ?ondansetron (ZOFRAN ODT) 8 MG disintegrating tablet 8mg  ODT q4 hours prn nausea 01/05/15   01/07/15, MD  ?ondansetron (ZOFRAN) 4 MG tablet Take 1 tablet (4 mg total) by mouth every 6 (six) hours. 02/09/16   02/11/16, PA-C  ?ranitidine (ZANTAC) 150 MG tablet Take 1 tablet (150 mg total) by mouth 2 (two) times daily. 01/10/15   01/12/15, MD  ?sucralfate (CARAFATE) 1 GM/10ML suspension Take 10 mLs (1 g total) by mouth 4 (four) times daily -  with meals and at bedtime. 01/10/15   01/12/15, MD  ?   ? ?Allergies    ?Patient has no allergy information on record.   ? ?Review of Systems   ?Review of Systems ? ?Physical Exam ?Updated Vital Signs ?BP (!)  167/93 (BP Location: Right Arm)   Pulse (!) 106   Temp 98.7 ?F (37.1 ?C) (Oral)   Resp 18   Ht 5\' 9"  (1.753 m)   Wt 99.8 kg   SpO2 99%   BMI 32.49 kg/m?  ?Physical Exam ?Vitals and nursing note reviewed.  ?Constitutional:   ?   Appearance: He is well-developed.  ?HENT:  ?   Head: Normocephalic and atraumatic.  ?Eyes:  ?   Pupils: Pupils are equal, round, and reactive to light.  ?Neck:  ?   Vascular: No JVD.  ?Cardiovascular:  ?   Rate and Rhythm: Normal rate and regular rhythm.  ?   Heart sounds: No murmur heard. ?  No friction rub. No gallop.  ?Pulmonary:  ?   Effort: No respiratory distress.  ?   Breath sounds: No wheezing.  ?Abdominal:  ?   General: There is no distension.  ?   Tenderness: There is no abdominal tenderness. There is no guarding or rebound.  ?Musculoskeletal:     ?   General: Swelling and tenderness present. Normal range of motion.  ?   Cervical back: Normal range of motion and neck supple.  ?   Comments: Pain and swelling to the left lateral elbow.  No obvious pain at the base of the  olecranon.  I am able to range the elbow without significant tenderness.  Pain worse at the lateral epicondyle.  ?Skin: ?   Coloration: Skin is not pale.  ?   Findings: No rash.  ?Neurological:  ?   Mental Status: He is alert and oriented to person, place, and time.  ?Psychiatric:     ?   Behavior: Behavior normal.  ? ? ?ED Results / Procedures / Treatments   ?Labs ?(all labs ordered are listed, but only abnormal results are displayed) ?Labs Reviewed - No data to display ? ?EKG ?None ? ?Radiology ?DG Elbow Complete Left ? ?Result Date: 01/03/2022 ?CLINICAL DATA:  Elbow pain with swelling EXAM: LEFT ELBOW - COMPLETE 3+ VIEW COMPARISON:  None. FINDINGS: Soft tissue swelling along the radial side of the elbow. No acute fracture is seen. Irregularity at the capitellum and radial head with slight inferior subluxation of the radial head on lateral view. IMPRESSION: 1. Abnormal appearance of the radiocapitellar  articulation with irregularity at the capitellum and mild radial head deformity, appearance suggests chronic process, possibly due to prior AVN or trauma at the capitellum. There is however soft tissue swelling at this location suggesting acute inflammatory process. MRI evaluation could be obtained if deemed clinically appropriate. Electronically Signed   By: Jasmine PangKim  Fujinaga M.D.   On: 01/03/2022 18:59   ? ?Procedures ?Procedures  ? ? ?Medications Ordered in ED ?Medications  ?ketorolac (TORADOL) 15 MG/ML injection 15 mg (15 mg Intramuscular Given 01/03/22 1908)  ? ? ?ED Course/ Medical Decision Making/ A&P ?  ?                        ?Medical Decision Making ?Amount and/or Complexity of Data Reviewed ?Radiology: ordered. ? ?Risk ?Prescription drug management. ? ? ?36 yo M with a chief complaint of left elbow pain.  This been going on for a couple weeks.  Less likely to be septic arthritis by timeline.  Has a known chronic deformity of the elbows.  I am able to range the elbow without significant tenderness.  No fevers.  We will obtain a plain film. ? ?Plain film independently interpreted by me with some irregularity of the radial head.  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  Will place in a sling.  Given orthopedic follow-up. ? ?8:13 PM:  I have discussed the diagnosis/risks/treatment options with the patient.  Evaluation and diagnostic testing in the emergency department does not suggest an emergent condition requiring admission or immediate intervention beyond what has been performed at this time.  They will follow up with  Ortho. We also discussed returning to the ED immediately if new or worsening sx occur. We discussed the sx which are most concerning (e.g., sudden worsening pain, fever, inability to tolerate by mouth) that necessitate immediate return. Medications administered to the patient during their visit and any new prescriptions provided to the patient are listed below. ? ?Medications given  during this visit ?Medications  ?ketorolac (TORADOL) 15 MG/ML injection 15 mg (15 mg Intramuscular Given 01/03/22 1908)  ? ? ? ?The patient appears reasonably screen and/or stabilized for discharge and I doubt any other medical condition or other [facility] requiring further screening, evaluation, or treatment in the ED at this time prior to discharge.  ? ? ? ? ? ? ? ? ? ?Final Clinical Impression(s) / ED Diagnoses ?Final diagnoses:  ?Left elbow pain  ? ? ?Rx / DC Orders ?ED Discharge Orders   ? ? None  ? ?  ? ? ?  ?  Melene Plan, DO ?01/03/22 2013 ? ?

## 2022-01-05 ENCOUNTER — Telehealth: Payer: Self-pay | Admitting: Orthopaedic Surgery

## 2022-01-05 NOTE — Telephone Encounter (Signed)
Please advise on what to do. Pt needs to be seen in the office correct? ?

## 2022-01-05 NOTE — Telephone Encounter (Signed)
IC patient. Advised would need OV for eval with Dr Ophelia Charter. Offered to work him in on Friday but he stated he could not do Friday.  ?I put him on schedule to see Dr Ophelia Charter on 05/02 ?

## 2022-01-05 NOTE — Telephone Encounter (Signed)
Pt called and did not set an appt. Pt states he was told to call and ask for Dr. Ophelia Charter and go over xrays for left elbow pains and see what next steps are. Pt is a new pt and was seen in [facility] ED. Pt hung up phone. Pt phone number is [phone]. ?

## 2022-01-11 ENCOUNTER — Ambulatory Visit: Payer: No Typology Code available for payment source | Admitting: Orthopaedic Surgery

## 2022-01-11 ENCOUNTER — Encounter: Payer: Self-pay | Admitting: Orthopaedic Surgery

## 2022-01-11 DIAGNOSIS — Q740 Other congenital malformations of upper limb(s), including shoulder girdle: Secondary | ICD-10-CM

## 2022-01-24 ENCOUNTER — Encounter: Payer: Self-pay | Admitting: Orthopaedic Surgery

## 2022-01-24 DIAGNOSIS — Q740 Other congenital malformations of upper limb(s), including shoulder girdle: Secondary | ICD-10-CM | POA: Insufficient documentation

## 2022-01-24 NOTE — Progress Notes (Signed)
? ?Office Visit Note ?  ?Patient: Wyatt Vance           ?Date of Birth: 03-29-1986           ?MRN: [id number] ?Visit Date: 01/11/2022 ?             ?Requested by: No referring provider defined for this encounter. ?PCP: Patient, No Pcp Per (Inactive) ? ? 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 ? ?Plan: Patient's nailbeds are normal no evidence of nail-patella syndrome, Apert syndrome or other orthopedic conditions associated with congenital dislocations of the radial head.  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  He can use some ice topical Aspercreme.  No surgical treatment recommended at this time.  He can discontinue the sling and office follow-up as needed. ? ?Follow-Up Instructions: No follow-ups on file.  ? ?Orders:  ?No orders of the defined types were placed in this encounter. ? ?No orders of the defined types were placed in this encounter. ? ? ? ? Procedures: ?No procedures performed ? ? ?Clinical Data: ?No additional findings. ? ? ?Subjective: ?Chief Complaint  ?Patient presents with  ? Left Elbow - Pain  ? ? ?HPI 36 year old male seen in the emergency room 01/03/2022 with left elbow pain.  Patient has congenital problems not able to extend either elbow.  He has been seen by an orthopedic surgeon when he was young and surgery was not recommended.  He is at the painful lateral aspect of the elbow.  He is taken ibuprofen without relief.  Patient continues to do normal work activity has a sling he has been using off and on.  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 ? ?Review of Systems all systems are noncontributory to HPI. ? ? ?Objective: ?Vital Signs: BP (!) 137/91   Pulse 80   Ht 5\' 11"  (1.803 m)   Wt 220 lb (99.8 kg)   BMI 30.68 kg/m?  ? ?Physical Exam ?Constitutional:   ?   Appearance: He is well-developed.  ?HENT:  ?   Head: Normocephalic and atraumatic.  ?   Right  Ear: External ear normal.  ?   Left Ear: External ear normal.  ?Eyes:  ?   Pupils: Pupils are equal, round, and reactive to light.  ?Neck:  ?   Thyroid: No thyromegaly.  ?   Trachea: No tracheal deviation.  ?Cardiovascular:  ?   Rate and Rhythm: Normal rate.  ?Pulmonary:  ?   Effort: Pulmonary effort is normal.  ?   Breath sounds: No wheezing.  ?Abdominal:  ?   General: Bowel sounds are normal.  ?   Palpations: Abdomen is soft.  ?Musculoskeletal:  ?   Cervical back: Neck supple.  ?Skin: ?   General: Skin is warm and dry.  ?   Capillary Refill: Capillary refill takes less than 2 seconds.  ?Neurological:  ?   Mental Status: He is alert and oriented to person, place, and time.  ?Psychiatric:     ?   Behavior: Behavior normal.     ?   Thought Content: Thought content normal.     ?   Judgment: Judgment normal.  ? ? ?Ortho ExamPatient lacks 20 to 30 degrees elbow extension right and left.  Some tenderness of the lateral epicondyle right and left. ? ?Specialty Comments:  ?No specialty comments available. ? ?Imaging: ?Narrative & Impression  ?CLINICAL DATA:  Elbow pain with swelling ?  ?  EXAM: ?LEFT ELBOW - COMPLETE 3+ VIEW ?  ?COMPARISON:  None. ?  ?FINDINGS: ?Soft tissue swelling along the radial side of the elbow. No acute ?fracture is seen. Irregularity at the capitellum and radial head ?with slight inferior subluxation of the radial head on lateral view. ?  ?IMPRESSION: ?1. Abnormal appearance of the radiocapitellar articulation with ?irregularity at the capitellum and mild radial head deformity, ?appearance suggests chronic process, possibly due to prior AVN or ?trauma at the capitellum. There is however soft tissue swelling at ?this location suggesting acute inflammatory process. MRI evaluation ?could be obtained if deemed clinically appropriate. ?  ?  ?Electronically Signed ?  By: Jasmine Pang M.D. ?  On: 01/03/2022 18:59  ? ? ? ?PMFS History: ?Patient Active Problem List  ? Diagnosis Date Noted  ? Congenital  dislocation of radial head 01/24/2022  ? ?Past Medical History:  ?Diagnosis Date  ? Seasonal allergies   ?  ?No family history on file.  ?No past surgical history on file. ?Social History  ? ?Occupational History  ? Not on file  ?Tobacco Use  ? Smoking status: Never  ? Smokeless tobacco: Never  ?Vaping Use  ? Vaping Use: Never used  ?Substance and Sexual Activity  ? Alcohol use: No  ? Drug use: No  ? Sexual activity: Not on file  ? ? ? ? ? ? ?

## 2023-08-22 IMAGING — DX DG ELBOW COMPLETE 3+V*L*
4 series · 4 of 4 positions shown · non-contrast
Comparison: None.

CLINICAL DATA: Elbow pain with swelling

EXAM:
LEFT ELBOW - COMPLETE 3+ VIEW

[elbow ap]
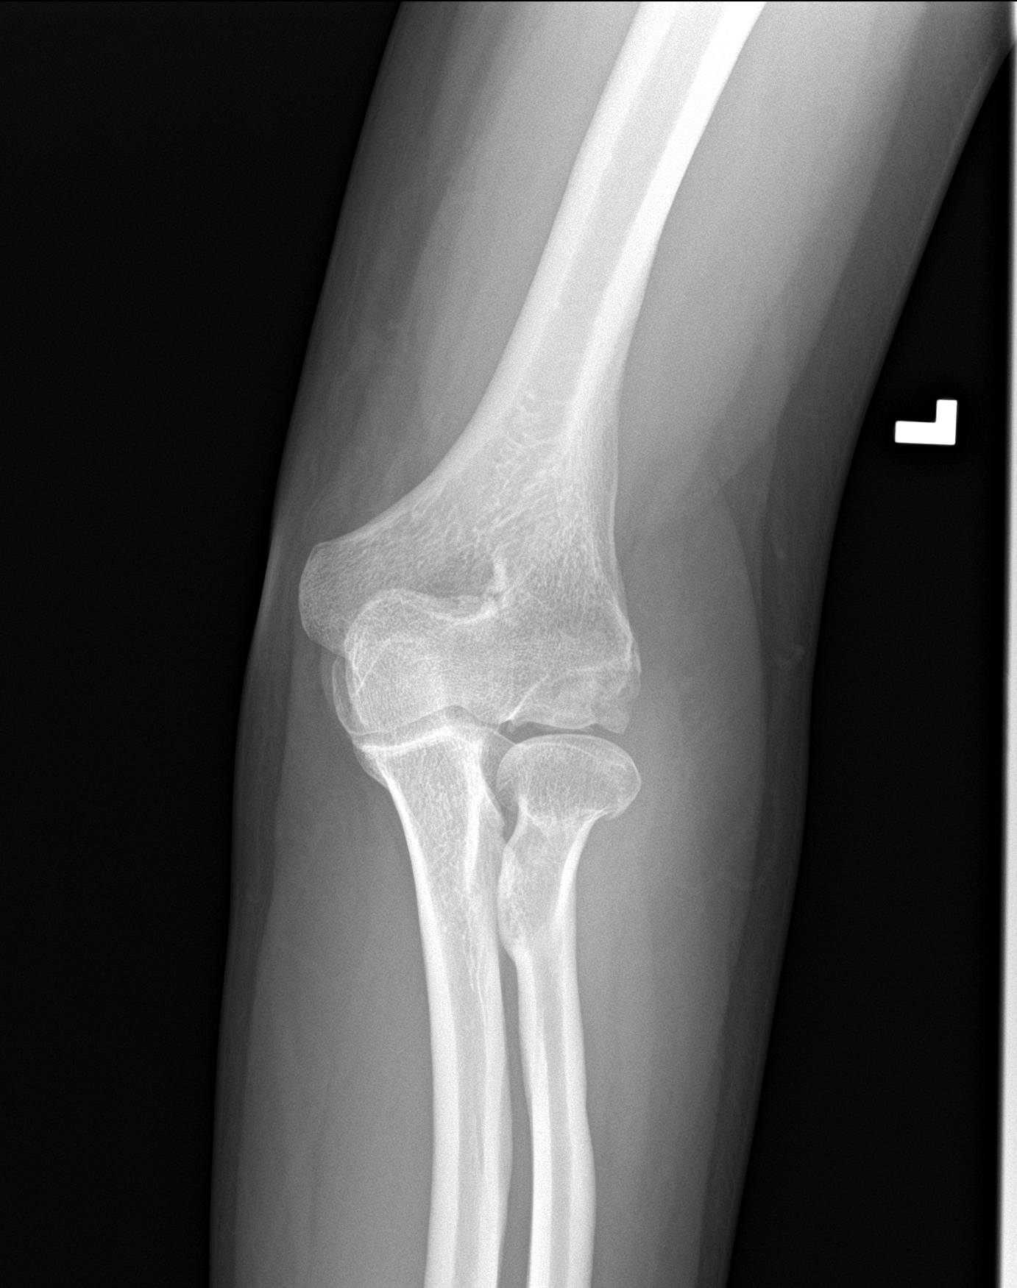

[elbow obl (1 of 2)]
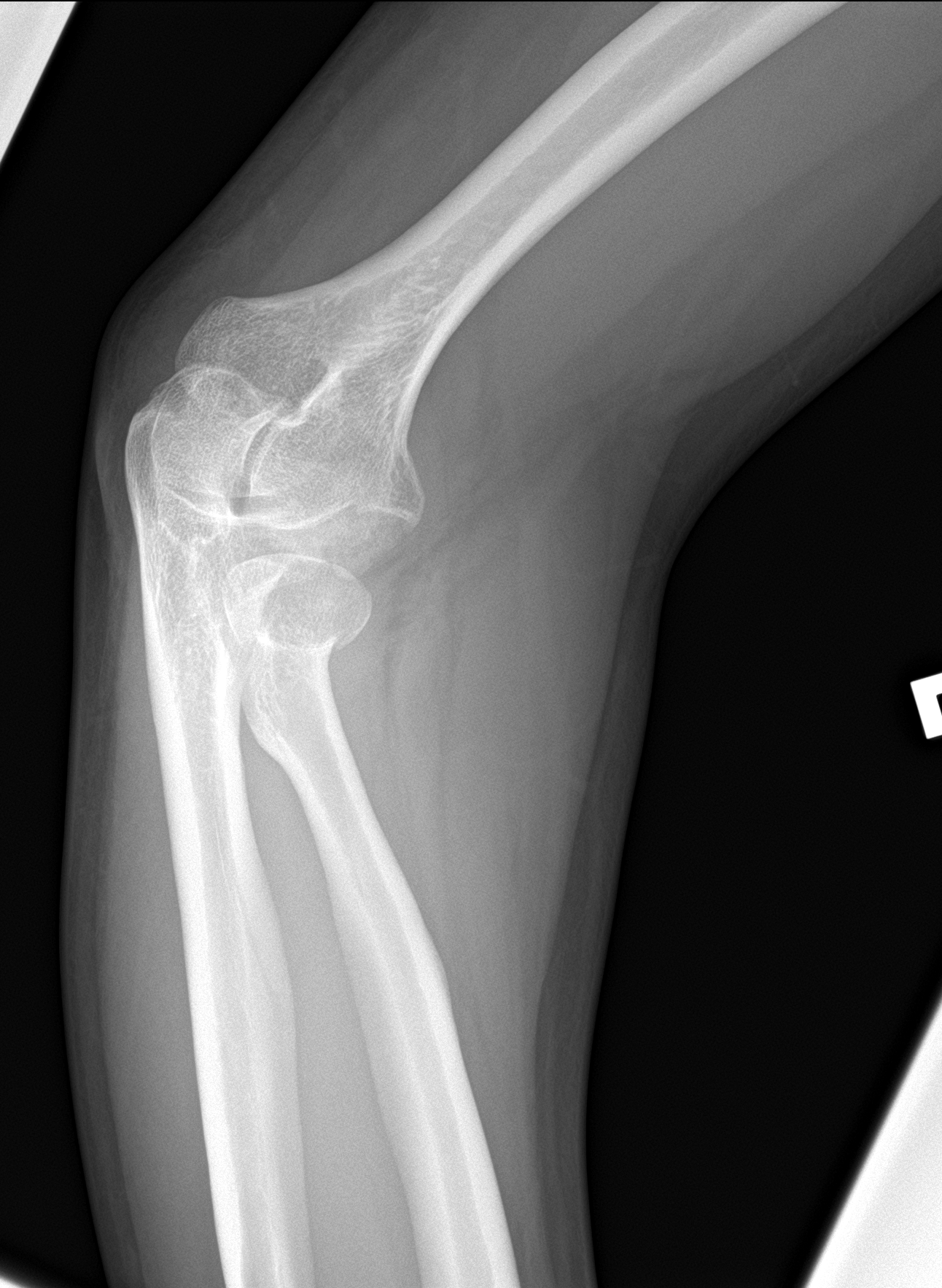

[elbow obl (2 of 2)]
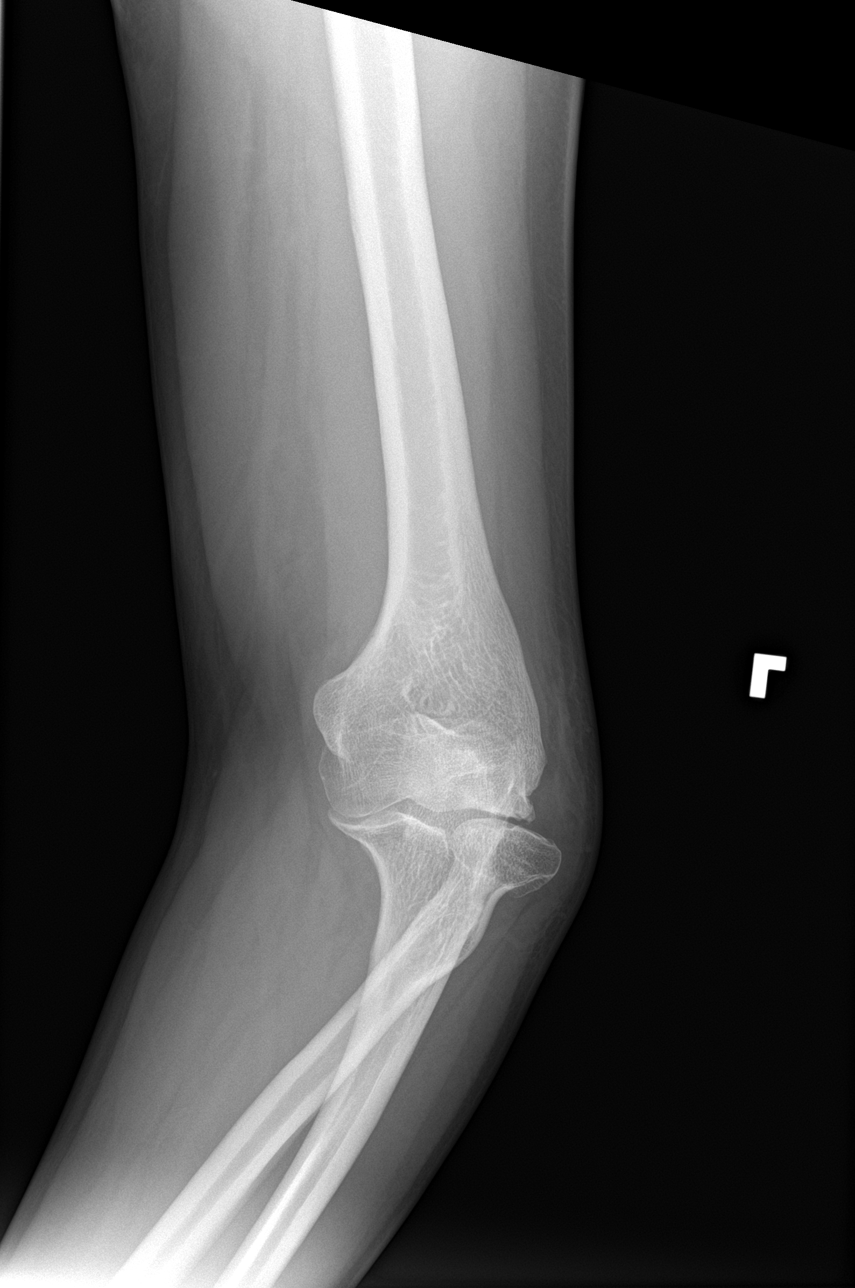

[elbow lat]
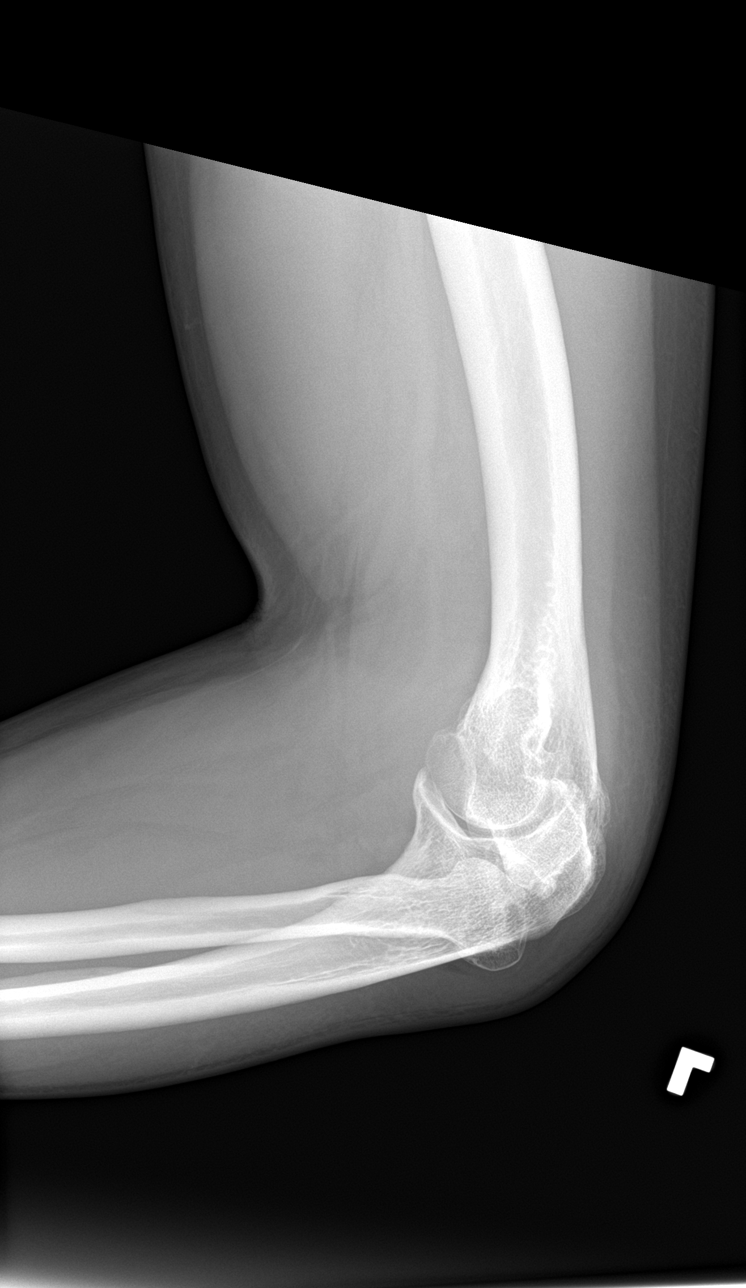

[4 of 4 positions shown; findings below may reference images not displayed]

FINDINGS: Soft tissue swelling along the radial side of the elbow. No acute
fracture is seen. Irregularity at the capitellum and radial head
with slight inferior subluxation of the radial head on lateral view.
IMPRESSION: 1. Abnormal appearance of the radiocapitellar articulation with
irregularity at the capitellum and mild radial head deformity,
appearance suggests chronic process, possibly due to prior AVN or
trauma at the capitellum. There is however soft tissue swelling at
this location suggesting acute inflammatory process. MRI evaluation
could be obtained if deemed clinically appropriate.
